# Patient Record
Sex: Female | Born: 1985 | Race: Black or African American | Hispanic: No | Marital: Married | Smoking: Never smoker
Health system: Southern US, Community
[De-identification: ages and names within clinical notes are randomized; demographics above are authoritative.]

## PROBLEM LIST (undated history)

## (undated) DIAGNOSIS — E669 Obesity, unspecified: Secondary | ICD-10-CM

## (undated) DIAGNOSIS — M549 Dorsalgia, unspecified: Secondary | ICD-10-CM

## (undated) DIAGNOSIS — E039 Hypothyroidism, unspecified: Secondary | ICD-10-CM

## (undated) DIAGNOSIS — R600 Localized edema: Secondary | ICD-10-CM

## (undated) DIAGNOSIS — D649 Anemia, unspecified: Secondary | ICD-10-CM

## (undated) DIAGNOSIS — I10 Essential (primary) hypertension: Secondary | ICD-10-CM

## (undated) DIAGNOSIS — M2550 Pain in unspecified joint: Secondary | ICD-10-CM

## (undated) DIAGNOSIS — R002 Palpitations: Secondary | ICD-10-CM

## (undated) DIAGNOSIS — G4730 Sleep apnea, unspecified: Secondary | ICD-10-CM

## (undated) DIAGNOSIS — F419 Anxiety disorder, unspecified: Secondary | ICD-10-CM

## (undated) DIAGNOSIS — R0602 Shortness of breath: Secondary | ICD-10-CM

## (undated) DIAGNOSIS — R7303 Prediabetes: Secondary | ICD-10-CM

## (undated) DIAGNOSIS — E559 Vitamin D deficiency, unspecified: Secondary | ICD-10-CM

## (undated) DIAGNOSIS — IMO0002 Reserved for concepts with insufficient information to code with codable children: Secondary | ICD-10-CM

## (undated) HISTORY — DX: Anxiety disorder, unspecified: F41.9

## (undated) HISTORY — DX: Palpitations: R00.2

## (undated) HISTORY — DX: Obesity, unspecified: E66.9

## (undated) HISTORY — PX: PILONIDAL CYST EXCISION: SHX744

## (undated) HISTORY — DX: Prediabetes: R73.03

## (undated) HISTORY — DX: Vitamin D deficiency, unspecified: E55.9

## (undated) HISTORY — DX: Hypothyroidism, unspecified: E03.9

## (undated) HISTORY — DX: Shortness of breath: R06.02

## (undated) HISTORY — DX: Localized edema: R60.0

## (undated) HISTORY — DX: Dorsalgia, unspecified: M54.9

## (undated) HISTORY — DX: Anemia, unspecified: D64.9

## (undated) HISTORY — DX: Sleep apnea, unspecified: G47.30

## (undated) HISTORY — DX: Essential (primary) hypertension: I10

## (undated) HISTORY — PX: BREAST CYST EXCISION: SHX579

## (undated) HISTORY — DX: Pain in unspecified joint: M25.50

---

## 2005-04-11 DIAGNOSIS — IMO0002 Reserved for concepts with insufficient information to code with codable children: Secondary | ICD-10-CM

## 2005-04-11 DIAGNOSIS — R87619 Unspecified abnormal cytological findings in specimens from cervix uteri: Secondary | ICD-10-CM

## 2005-04-11 HISTORY — DX: Reserved for concepts with insufficient information to code with codable children: IMO0002

## 2005-04-11 HISTORY — DX: Unspecified abnormal cytological findings in specimens from cervix uteri: R87.619

## 2006-10-20 ENCOUNTER — Ambulatory Visit (HOSPITAL_COMMUNITY): Admission: RE | Admit: 2006-10-20 | Discharge: 2006-10-20 | Payer: Self-pay | Admitting: Obstetrics and Gynecology

## 2008-10-19 ENCOUNTER — Emergency Department (HOSPITAL_COMMUNITY): Admission: EM | Admit: 2008-10-19 | Discharge: 2008-10-19 | Payer: Self-pay | Admitting: Emergency Medicine

## 2009-02-09 HISTORY — PX: ANKLE SURGERY: SHX546

## 2009-08-14 ENCOUNTER — Emergency Department: Payer: Self-pay | Admitting: Emergency Medicine

## 2010-01-18 ENCOUNTER — Ambulatory Visit: Payer: Self-pay | Admitting: Internal Medicine

## 2010-02-02 ENCOUNTER — Ambulatory Visit: Payer: Self-pay | Admitting: Emergency Medicine

## 2010-02-05 ENCOUNTER — Ambulatory Visit: Payer: Self-pay | Admitting: Emergency Medicine

## 2010-02-10 LAB — PATHOLOGY REPORT

## 2010-04-11 NOTE — L&D Delivery Note (Signed)
Patient was C/C/0 and pushed for 8 minutes with epidural.   NSVD  female infant, Apgars 9/9, weight 8#9.   The patient had one laceration second degree laceration repaired with 2-0 vicryl. Fundus was firm. EBL was expected. Placenta was delivered intact. Vagina was clear.  Baby was vigorous to bedside.  Misty Mathis

## 2010-06-08 LAB — RUBELLA ANTIBODY, IGM: Rubella: IMMUNE

## 2010-06-08 LAB — ABO/RH: RH Type: POSITIVE

## 2010-06-08 LAB — HEPATITIS B SURFACE ANTIGEN: Hepatitis B Surface Ag: NEGATIVE

## 2010-07-14 ENCOUNTER — Other Ambulatory Visit (HOSPITAL_COMMUNITY): Payer: Self-pay | Admitting: Obstetrics & Gynecology

## 2010-07-14 DIAGNOSIS — Z3689 Encounter for other specified antenatal screening: Secondary | ICD-10-CM

## 2010-07-18 LAB — WET PREP, GENITAL
Clue Cells Wet Prep HPF POC: NONE SEEN
Trich, Wet Prep: NONE SEEN
Yeast Wet Prep HPF POC: NONE SEEN

## 2010-07-18 LAB — POCT I-STAT, CHEM 8
Calcium, Ion: 1.16 mmol/L (ref 1.12–1.32)
Chloride: 103 mEq/L (ref 96–112)
Glucose, Bld: 89 mg/dL (ref 70–99)
HCT: 35 % — ABNORMAL LOW (ref 36.0–46.0)

## 2010-07-18 LAB — URINALYSIS, ROUTINE W REFLEX MICROSCOPIC
Bilirubin Urine: NEGATIVE
Glucose, UA: NEGATIVE mg/dL
Ketones, ur: NEGATIVE mg/dL
Nitrite: NEGATIVE
pH: 6.5 (ref 5.0–8.0)

## 2010-07-18 LAB — URINE MICROSCOPIC-ADD ON

## 2010-08-12 ENCOUNTER — Ambulatory Visit (HOSPITAL_COMMUNITY)
Admission: RE | Admit: 2010-08-12 | Discharge: 2010-08-12 | Disposition: A | Discharge status: Home / Self Care | Payer: 59 | Source: Ambulatory Visit | Attending: Obstetrics & Gynecology | Admitting: Obstetrics & Gynecology

## 2010-08-12 ENCOUNTER — Ambulatory Visit (HOSPITAL_COMMUNITY): Payer: 59

## 2010-08-12 DIAGNOSIS — Z3689 Encounter for other specified antenatal screening: Secondary | ICD-10-CM

## 2010-08-12 DIAGNOSIS — Z363 Encounter for antenatal screening for malformations: Secondary | ICD-10-CM | POA: Insufficient documentation

## 2010-08-12 DIAGNOSIS — Z1389 Encounter for screening for other disorder: Secondary | ICD-10-CM | POA: Insufficient documentation

## 2010-08-12 DIAGNOSIS — O358XX0 Maternal care for other (suspected) fetal abnormality and damage, not applicable or unspecified: Secondary | ICD-10-CM | POA: Insufficient documentation

## 2010-08-13 ENCOUNTER — Ambulatory Visit (HOSPITAL_COMMUNITY): Payer: 59

## 2010-10-25 LAB — RPR: RPR: NONREACTIVE

## 2010-12-14 LAB — STREP B DNA PROBE: GBS: NEGATIVE

## 2011-01-05 ENCOUNTER — Inpatient Hospital Stay (HOSPITAL_COMMUNITY): Payer: Medicaid Other

## 2011-01-05 ENCOUNTER — Inpatient Hospital Stay (HOSPITAL_COMMUNITY)
Admission: AD | Admit: 2011-01-05 | Discharge: 2011-01-06 | Disposition: A | Discharge status: Home / Self Care | Payer: Medicaid Other | Source: Ambulatory Visit | Attending: Obstetrics and Gynecology | Admitting: Obstetrics and Gynecology

## 2011-01-05 ENCOUNTER — Encounter (HOSPITAL_COMMUNITY): Payer: Self-pay | Admitting: *Deleted

## 2011-01-05 DIAGNOSIS — O368190 Decreased fetal movements, unspecified trimester, not applicable or unspecified: Secondary | ICD-10-CM | POA: Insufficient documentation

## 2011-01-05 NOTE — Progress Notes (Signed)
AT 8 PM - CALLED AND TALKED TO DR Dareen Piano- HE TOLD HER TO DRINK A COKE- IF  NOT INCREASE MOVEMENT- THEN TO COME TO HOSPITAL.

## 2011-01-05 NOTE — Progress Notes (Signed)
PT SAYS SHE WENT TO DR ON Tuesday- BABY MOVING   SLIGHT  LESS THAN NL.   SAYS SHE FEELS HIM MOVE- JUST NOT AS MUCH AS NL - LAST TIME - WAS IN LOBBY. 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

## 2011-01-05 NOTE — Progress Notes (Signed)
Pt reports decrease in fetal movement today. Denies LOF or bleeding

## 2011-01-06 NOTE — ED Notes (Signed)
Pt is a 24y/o black female, G4P0 at 39 weeks who presented to ER c/o a decrease in FM.  She has FM but states that it is less than normal.  Pt had a reactive NST, but with decreased BTB variability. A BPP was 8/8.  She also had an accel with scalp stim.  Pt was discharged to home and told to continue [facility] B[facility].

## 2011-01-13 ENCOUNTER — Encounter (HOSPITAL_COMMUNITY): Payer: Self-pay | Admitting: Anesthesiology

## 2011-01-13 ENCOUNTER — Inpatient Hospital Stay (HOSPITAL_COMMUNITY): Payer: Medicaid Other | Admitting: Anesthesiology

## 2011-01-13 ENCOUNTER — Encounter (HOSPITAL_COMMUNITY): Payer: Self-pay | Admitting: *Deleted

## 2011-01-13 ENCOUNTER — Inpatient Hospital Stay (HOSPITAL_COMMUNITY)
Admission: AD | Admit: 2011-01-13 | Discharge: 2011-01-15 | DRG: 767 | Disposition: A | Discharge status: Home / Self Care | Payer: Medicaid Other | Source: Ambulatory Visit | Attending: Obstetrics and Gynecology | Admitting: Obstetrics and Gynecology

## 2011-01-13 DIAGNOSIS — A6000 Herpesviral infection of urogenital system, unspecified: Secondary | ICD-10-CM | POA: Diagnosis present

## 2011-01-13 DIAGNOSIS — Z302 Encounter for sterilization: Secondary | ICD-10-CM

## 2011-01-13 DIAGNOSIS — B009 Herpesviral infection, unspecified: Secondary | ICD-10-CM | POA: Insufficient documentation

## 2011-01-13 DIAGNOSIS — O98519 Other viral diseases complicating pregnancy, unspecified trimester: Secondary | ICD-10-CM | POA: Diagnosis present

## 2011-01-13 DIAGNOSIS — Z331 Pregnant state, incidental: Secondary | ICD-10-CM

## 2011-01-13 DIAGNOSIS — D573 Sickle-cell trait: Secondary | ICD-10-CM | POA: Diagnosis present

## 2011-01-13 DIAGNOSIS — O9902 Anemia complicating childbirth: Secondary | ICD-10-CM | POA: Diagnosis present

## 2011-01-13 HISTORY — DX: Reserved for concepts with insufficient information to code with codable children: IMO0002

## 2011-01-13 LAB — CBC
MCH: 30.2 pg (ref 26.0–34.0)
MCHC: 33.2 g/dL (ref 30.0–36.0)
MCV: 91 fL (ref 78.0–100.0)
Platelets: 257 10*3/uL (ref 150–400)

## 2011-01-13 MED ORDER — LACTATED RINGERS IV SOLN
INTRAVENOUS | Status: DC
  Administered 2011-01-13: 500 mL via INTRAVENOUS
  Administered 2011-01-14: 01:00:00 via INTRAVENOUS

## 2011-01-13 MED ORDER — ONDANSETRON HCL 4 MG/2ML IJ SOLN: 4.0000 mg | Freq: Four times a day (QID) | INTRAMUSCULAR | Status: DC | PRN

## 2011-01-13 MED ORDER — LACTATED RINGERS IV SOLN: 500.0000 mL | INTRAVENOUS | Status: DC | PRN

## 2011-01-13 MED ORDER — ACETAMINOPHEN 325 MG PO TABS: 650.0000 mg | ORAL_TABLET | ORAL | Status: DC | PRN

## 2011-01-13 MED ORDER — PHENYLEPHRINE 40 MCG/ML (10ML) SYRINGE FOR IV PUSH (FOR BLOOD PRESSURE SUPPORT)
80.0000 ug | PREFILLED_SYRINGE | INTRAVENOUS | Status: DC | PRN
  Filled 2011-01-13: qty 5

## 2011-01-13 MED ORDER — CITRIC ACID-SODIUM CITRATE 334-500 MG/5ML PO SOLN: 30.0000 mL | ORAL | Status: DC | PRN

## 2011-01-13 MED ORDER — FLEET ENEMA 7-19 GM/118ML RE ENEM: 1.0000 | ENEMA | RECTAL | Status: DC | PRN

## 2011-01-13 MED ORDER — OXYTOCIN BOLUS FROM INFUSION
500.0000 mL | Freq: Once | INTRAVENOUS | Status: DC
  Filled 2011-01-13: qty 500

## 2011-01-13 MED ORDER — IBUPROFEN 600 MG PO TABS: 600.0000 mg | ORAL_TABLET | Freq: Four times a day (QID) | ORAL | Status: DC | PRN

## 2011-01-13 MED ORDER — OXYCODONE-ACETAMINOPHEN 5-325 MG PO TABS: 2.0000 | ORAL_TABLET | ORAL | Status: DC | PRN

## 2011-01-13 MED ORDER — DIPHENHYDRAMINE HCL 50 MG/ML IJ SOLN: 12.5000 mg | INTRAMUSCULAR | Status: DC | PRN

## 2011-01-13 MED ORDER — EPHEDRINE 5 MG/ML INJ
10.0000 mg | INTRAVENOUS | Status: DC | PRN
  Filled 2011-01-13 (×2): qty 4

## 2011-01-13 MED ORDER — FENTANYL 2.5 MCG/ML BUPIVACAINE 1/10 % EPIDURAL INFUSION (WH - ANES)
14.0000 mL/h | INTRAMUSCULAR | Status: DC
  Administered 2011-01-13: 14 mL/h via EPIDURAL
  Filled 2011-01-13: qty 60

## 2011-01-13 MED ORDER — LIDOCAINE HCL (PF) 1 % IJ SOLN
30.0000 mL | INTRAMUSCULAR | Status: DC | PRN
  Administered 2011-01-14: 30 mL via SUBCUTANEOUS
  Filled 2011-01-13: qty 30

## 2011-01-13 MED ORDER — EPHEDRINE 5 MG/ML INJ
10.0000 mg | INTRAVENOUS | Status: DC | PRN
  Filled 2011-01-13: qty 4

## 2011-01-13 MED ORDER — OXYTOCIN 20 UNITS IN LACTATED RINGERS INFUSION - SIMPLE
125.0000 mL/h | Freq: Once | INTRAVENOUS | Status: AC
  Administered 2011-01-14: 999 mL/h via INTRAVENOUS
  Filled 2011-01-13: qty 1000

## 2011-01-13 MED ORDER — LIDOCAINE HCL 1.5 % IJ SOLN
INTRAMUSCULAR | Status: DC | PRN
  Administered 2011-01-13: 2 mL via INTRADERMAL
  Administered 2011-01-13 (×2): 5 mL via INTRADERMAL

## 2011-01-13 MED ORDER — PHENYLEPHRINE 40 MCG/ML (10ML) SYRINGE FOR IV PUSH (FOR BLOOD PRESSURE SUPPORT)
80.0000 ug | PREFILLED_SYRINGE | INTRAVENOUS | Status: DC | PRN
  Filled 2011-01-13 (×2): qty 5

## 2011-01-13 MED ORDER — LACTATED RINGERS IV SOLN: 500.0000 mL | Freq: Once | INTRAVENOUS | Status: DC

## 2011-01-13 NOTE — Anesthesia Procedure Notes (Signed)
Epidural Patient location during procedure: OB Start time: 01/13/2011 11:48 PM Reason for block: procedure for pain  Staffing Performed by: anesthesiologist   Preanesthetic Checklist Completed: patient identified, site marked, surgical consent, pre-op evaluation, timeout performed, IV checked, risks and benefits discussed and monitors and equipment checked  Epidural Patient position: sitting Prep: site prepped and draped and DuraPrep Patient monitoring: continuous pulse ox and blood pressure Approach: midline Injection technique: LOR air  Needle:  Needle type: Tuohy  Needle gauge: 17 G Needle length: 9 cm Needle insertion depth: 6 cm Catheter type: closed end flexible Catheter size: 19 Gauge Catheter at skin depth: 11 cm Test dose: negative  Assessment Events: blood not aspirated, injection not painful, no injection resistance, negative IV test and no paresthesia

## 2011-01-13 NOTE — Progress Notes (Signed)
States Dr Aldona Bar stripped her membrames on Mon-has been contracting since then-has gotten harder today-was 4-5 at 10am in the office

## 2011-01-13 NOTE — Progress Notes (Signed)
SAYS HAD STRIPPED MEMBRANES ON Monday- VE- 2-3 CM .  BUT TODAY- 4-5 CM.     HURT BAD.

## 2011-01-13 NOTE — Anesthesia Preprocedure Evaluation (Signed)
Anesthesia Evaluation  Name, MR# and DOB Patient awake  General Assessment Comment  Reviewed: Allergy & Precautions, H&P , NPO status , Patient's Chart, lab work & pertinent test results, reviewed documented beta blocker date and time   History of Anesthesia Complications Negative for: history of anesthetic complications  Airway Mallampati: II TM Distance: >3 FB Neck ROM: full    Dental  (+) Teeth Intact   Pulmonary  clear to auscultation        Cardiovascular regular Normal    Neuro/Psych Negative Neurological ROS  Negative Psych ROS   GI/Hepatic negative GI ROS Neg liver ROS    Endo/Other  Morbid obesity  Renal/GU negative Renal ROS     Musculoskeletal   Abdominal   Peds  Hematology negative hematology ROS (+)   Anesthesia Other Findings   Reproductive/Obstetrics (+) Pregnancy                           Anesthesia Physical Anesthesia Plan  ASA: II  Anesthesia Plan: Epidural   Post-op Pain Management:    Induction:   Airway Management Planned:   Additional Equipment:   Intra-op Plan:   Post-operative Plan:   Informed Consent: I have reviewed the patients History and Physical, chart, labs and discussed the procedure including the risks, benefits and alternatives for the proposed anesthesia with the patient or authorized representative who has indicated his/her understanding and acceptance.     Plan Discussed with:   Anesthesia Plan Comments:         Anesthesia Quick Evaluation  

## 2011-01-13 NOTE — Progress Notes (Signed)
HX- HSV- DENIES OUTBREAK- TAKING VALTREX.  DENIES MRSA.

## 2011-01-14 ENCOUNTER — Encounter (HOSPITAL_COMMUNITY): Payer: Self-pay | Admitting: Anesthesiology

## 2011-01-14 ENCOUNTER — Inpatient Hospital Stay (HOSPITAL_COMMUNITY): Payer: Medicaid Other | Admitting: Anesthesiology

## 2011-01-14 ENCOUNTER — Other Ambulatory Visit: Payer: Self-pay | Admitting: Obstetrics & Gynecology

## 2011-01-14 ENCOUNTER — Encounter (HOSPITAL_COMMUNITY)
Admission: AD | Disposition: A | Discharge status: Home / Self Care | Payer: Self-pay | Source: Ambulatory Visit | Attending: Obstetrics and Gynecology

## 2011-01-14 ENCOUNTER — Encounter (HOSPITAL_COMMUNITY): Payer: Self-pay | Admitting: *Deleted

## 2011-01-14 HISTORY — PX: TUBAL LIGATION: SHX77

## 2011-01-14 LAB — RPR: RPR Ser Ql: NONREACTIVE

## 2011-01-14 SURGERY — LIGATION, FALLOPIAN TUBE, POSTPARTUM
Anesthesia: Choice | Site: Abdomen | Laterality: Bilateral | Wound class: Clean

## 2011-01-14 MED ORDER — LACTATED RINGERS IV SOLN
INTRAVENOUS | Status: DC
  Administered 2011-01-14 (×2): via INTRAVENOUS

## 2011-01-14 MED ORDER — LANOLIN HYDROUS EX OINT: TOPICAL_OINTMENT | CUTANEOUS | Status: DC | PRN

## 2011-01-14 MED ORDER — ZOLPIDEM TARTRATE 5 MG PO TABS: 5.0000 mg | ORAL_TABLET | Freq: Every evening | ORAL | Status: DC | PRN

## 2011-01-14 MED ORDER — ONDANSETRON HCL 4 MG PO TABS: 4.0000 mg | ORAL_TABLET | ORAL | Status: DC | PRN

## 2011-01-14 MED ORDER — MIDAZOLAM HCL 2 MG/2ML IJ SOLN
INTRAMUSCULAR | Status: AC
  Filled 2011-01-14: qty 2

## 2011-01-14 MED ORDER — KETOROLAC TROMETHAMINE 30 MG/ML IJ SOLN: 15.0000 mg | Freq: Once | INTRAMUSCULAR | Status: DC | PRN

## 2011-01-14 MED ORDER — DIBUCAINE 1 % RE OINT
1.0000 "application " | TOPICAL_OINTMENT | RECTAL | Status: DC | PRN
  Filled 2011-01-14: qty 28

## 2011-01-14 MED ORDER — ACETAMINOPHEN 325 MG PO TABS: 325.0000 mg | ORAL_TABLET | ORAL | Status: DC | PRN

## 2011-01-14 MED ORDER — KETOROLAC TROMETHAMINE 30 MG/ML IJ SOLN
INTRAMUSCULAR | Status: AC
  Filled 2011-01-14: qty 1

## 2011-01-14 MED ORDER — FENTANYL CITRATE 0.05 MG/ML IJ SOLN
INTRAMUSCULAR | Status: AC
  Filled 2011-01-14: qty 2

## 2011-01-14 MED ORDER — ONDANSETRON HCL 4 MG/2ML IJ SOLN
INTRAMUSCULAR | Status: DC | PRN
  Administered 2011-01-14: 4 mg via INTRAVENOUS

## 2011-01-14 MED ORDER — PRENATAL PLUS 27-1 MG PO TABS
1.0000 | ORAL_TABLET | Freq: Every day | ORAL | Status: DC
  Administered 2011-01-15: 1 via ORAL
  Filled 2011-01-14 (×2): qty 1

## 2011-01-14 MED ORDER — ONDANSETRON HCL 4 MG/2ML IJ SOLN
INTRAMUSCULAR | Status: AC
  Filled 2011-01-14: qty 2

## 2011-01-14 MED ORDER — WITCH HAZEL-GLYCERIN EX PADS: 1.0000 "application " | MEDICATED_PAD | CUTANEOUS | Status: DC | PRN

## 2011-01-14 MED ORDER — SODIUM CHLORIDE 0.9 % IV SOLN: INTRAVENOUS | Status: DC

## 2011-01-14 MED ORDER — TERBUTALINE SULFATE 1 MG/ML IJ SOLN: 0.2500 mg | Freq: Once | INTRAMUSCULAR | Status: DC | PRN

## 2011-01-14 MED ORDER — FENTANYL CITRATE 0.05 MG/ML IJ SOLN: 25.0000 ug | INTRAMUSCULAR | Status: DC | PRN

## 2011-01-14 MED ORDER — OXYCODONE-ACETAMINOPHEN 5-325 MG PO TABS
1.0000 | ORAL_TABLET | ORAL | Status: DC | PRN
  Administered 2011-01-14: 2 via ORAL
  Filled 2011-01-14: qty 2

## 2011-01-14 MED ORDER — PROMETHAZINE HCL 25 MG/ML IJ SOLN: 6.2500 mg | INTRAMUSCULAR | Status: DC | PRN

## 2011-01-14 MED ORDER — KETOROLAC TROMETHAMINE 30 MG/ML IJ SOLN
INTRAMUSCULAR | Status: DC | PRN
  Administered 2011-01-14: 30 mg via INTRAVENOUS

## 2011-01-14 MED ORDER — ONDANSETRON HCL 4 MG/2ML IJ SOLN: 4.0000 mg | INTRAMUSCULAR | Status: DC | PRN

## 2011-01-14 MED ORDER — SENNOSIDES-DOCUSATE SODIUM 8.6-50 MG PO TABS
2.0000 | ORAL_TABLET | Freq: Every day | ORAL | Status: DC
  Administered 2011-01-14: 2 via ORAL

## 2011-01-14 MED ORDER — ACETAMINOPHEN 325 MG PO TABS
650.0000 mg | ORAL_TABLET | Freq: Four times a day (QID) | ORAL | Status: DC | PRN
  Administered 2011-01-15: 650 mg via ORAL
  Filled 2011-01-14 (×2): qty 2

## 2011-01-14 MED ORDER — BENZOCAINE-MENTHOL 20-0.5 % EX AERO
INHALATION_SPRAY | CUTANEOUS | Status: AC
  Filled 2011-01-14: qty 56

## 2011-01-14 MED ORDER — SODIUM BICARBONATE 8.4 % IV SOLN
INTRAVENOUS | Status: AC
  Filled 2011-01-14: qty 50

## 2011-01-14 MED ORDER — PRENATAL PLUS 27-1 MG PO TABS
1.0000 | ORAL_TABLET | Freq: Every day | ORAL | Status: DC
Start: 2011-01-14 — End: 2011-01-14

## 2011-01-14 MED ORDER — BENZOCAINE-MENTHOL 20-0.5 % EX AERO
1.0000 "application " | INHALATION_SPRAY | CUTANEOUS | Status: DC | PRN
  Filled 2011-01-14: qty 56

## 2011-01-14 MED ORDER — TETANUS-DIPHTH-ACELL PERTUSSIS 5-2.5-18.5 LF-MCG/0.5 IM SUSP
0.5000 mL | Freq: Once | INTRAMUSCULAR | Status: AC
  Administered 2011-01-15: 0.5 mL via INTRAMUSCULAR
  Filled 2011-01-14: qty 0.5

## 2011-01-14 MED ORDER — SODIUM BICARBONATE 8.4 % IV SOLN
INTRAVENOUS | Status: DC | PRN
  Administered 2011-01-14: 3 mL via EPIDURAL

## 2011-01-14 MED ORDER — MIDAZOLAM HCL 5 MG/5ML IJ SOLN
INTRAMUSCULAR | Status: DC | PRN
  Administered 2011-01-14 (×2): 0.5 mg via INTRAVENOUS
  Administered 2011-01-14: 1 mg via INTRAVENOUS

## 2011-01-14 MED ORDER — SIMETHICONE 80 MG PO CHEW: 80.0000 mg | CHEWABLE_TABLET | ORAL | Status: DC | PRN

## 2011-01-14 MED ORDER — OXYTOCIN 20 UNITS IN LACTATED RINGERS INFUSION - SIMPLE
1.0000 m[IU]/min | INTRAVENOUS | Status: DC
  Administered 2011-01-14: 2 m[IU]/min via INTRAVENOUS
  Filled 2011-01-14: qty 1000

## 2011-01-14 MED ORDER — FENTANYL CITRATE 0.05 MG/ML IJ SOLN
INTRAMUSCULAR | Status: DC | PRN
  Administered 2011-01-14 (×2): 50 ug via INTRAVENOUS

## 2011-01-14 MED ORDER — LIDOCAINE-EPINEPHRINE (PF) 2 %-1:200000 IJ SOLN
INTRAMUSCULAR | Status: AC
  Filled 2011-01-14: qty 20

## 2011-01-14 SURGICAL SUPPLY — 16 items
CLOTH BEACON ORANGE TIMEOUT ST (SAFETY) ×2 IMPLANT
CONTAINER PREFILL 10% NBF 15ML (MISCELLANEOUS) ×4 IMPLANT
DRSG COVADERM PLUS 2X2 (GAUZE/BANDAGES/DRESSINGS) ×2 IMPLANT
GLOVE BIO SURGEON STRL SZ7.5 (GLOVE) ×4 IMPLANT
GOWN PREVENTION PLUS LG XLONG (DISPOSABLE) ×2 IMPLANT
GOWN PREVENTION PLUS XLARGE (GOWN DISPOSABLE) ×2 IMPLANT
NS IRRIG 1000ML POUR BTL (IV SOLUTION) ×2 IMPLANT
PACK ABDOMINAL MINOR (CUSTOM PROCEDURE TRAY) ×2 IMPLANT
SPONGE LAP 4X18 X RAY DECT (DISPOSABLE) IMPLANT
SUT PLAIN 1 NONE 54 (SUTURE) ×2 IMPLANT
SUT VIC AB 0 CT1 27 (SUTURE) ×1
SUT VIC AB 0 CT1 27XBRD ANBCTR (SUTURE) ×1 IMPLANT
SUT VIC AB 4-0 PS2 27 (SUTURE) ×2 IMPLANT
TOWEL OR 17X24 6PK STRL BLUE (TOWEL DISPOSABLE) ×4 IMPLANT
TRAY FOLEY CATH 14FR (SET/KITS/TRAYS/PACK) ×2 IMPLANT
WATER STERILE IRR 1000ML POUR (IV SOLUTION) ×2 IMPLANT

## 2011-01-14 NOTE — Anesthesia Postprocedure Evaluation (Signed)
  Anesthesia Post-op Note  Patient: Misty Mathis  Procedure(s) Performed:  POST PARTUM TUBAL LIGATION  Patient Location: PACU and Women's Unit  Anesthesia Type: Epidural  Level of Consciousness: awake, alert  and oriented  Airway and Oxygen Therapy: Patient Spontanous Breathing  Post-op Pain: none  Post-op Assessment: Post-op Vital signs reviewed and Patient's Cardiovascular Status Stable  Post-op Vital Signs: Reviewed and stable  Complications: No apparent anesthesia complications

## 2011-01-14 NOTE — Transfer of Care (Signed)
Immediate Anesthesia Transfer of Care Note  Patient: Misty Mathis  Procedure(s) Performed:  POST PARTUM TUBAL LIGATION  Patient Location: PACU  Anesthesia Type: Epidural  Level of Consciousness: awake, alert  and oriented  Airway & Oxygen Therapy: Patient Spontanous Breathing  Post-op Assessment: Report given to PACU RN and Post -op Vital signs reviewed and stable  Post vital signs: Reviewed and stable  Complications: No apparent anesthesia complications

## 2011-01-14 NOTE — Anesthesia Preprocedure Evaluation (Signed)
Anesthesia Evaluation  Name, MR# and DOB Patient awake  General Assessment Comment  Reviewed: Allergy & Precautions, H&P , NPO status , Patient's Chart, lab work & pertinent test results, reviewed documented beta blocker date and time   History of Anesthesia Complications Negative for: history of anesthetic complications  Airway Mallampati: IV TM Distance: >3 FB Neck ROM: full    Dental  (+) Teeth Intact   Pulmonary  clear to auscultation        Cardiovascular Exercise Tolerance: Good regular Normal    Neuro/Psych Negative Neurological ROS  Negative Psych ROS   GI/Hepatic negative GI ROS Neg liver ROS    Endo/Other  Negative Endocrine ROSMorbid obesity  Renal/GU negative Renal ROS     Musculoskeletal negative musculoskeletal ROS (+)   Abdominal   Peds negative pediatric ROS (+)  Hematology negative hematology ROS (+)   Anesthesia Other Findings   Reproductive/Obstetrics negative OB ROS (+) Pregnancy                           Anesthesia Physical Anesthesia Plan  ASA: III  Anesthesia Plan: Epidural   Post-op Pain Management:    Induction:   Airway Management Planned:   Additional Equipment:   Intra-op Plan:   Post-operative Plan:   Informed Consent:   Plan Discussed with:   Anesthesia Plan Comments:         Anesthesia Quick Evaluation

## 2011-01-14 NOTE — H&P (Addendum)
25 y.o. @40 .3  G3P3003 comes in c/o labor.  Otherwise has good fetal movement and no bleeding. No other complaints, denies any herpetic lesions or prodromal symptoms.  Past Medical History  Diagnosis Date  . Abnormal Pap smear   . Herpes simplex     Past Surgical History  Procedure Date  . Ankle surgery 02/2009  . Breast cyst excision <35yr    OB History    Grav Para Term Preterm Abortions TAB SAB Ect Mult Living   3 3 3       3      # Outc Date GA Lbr Len/2nd Wgt Sex Del Anes PTL Lv   1 TRM 10/12 [redacted]w[redacted]d 08:38 / 00:08 8lb9.2oz(3.89kg) M SVD EPI  Yes   2 TRM            3 TRM               History   Social History  . Marital Status: Married    Spouse Name: N/A    Number of Children: N/A  . Years of Education: N/A   Occupational History  . Not on file.   Social History Main Topics  . Smoking status: Not on file  . Smokeless tobacco: Not on file  . Alcohol Use:   . Drug Use:   . Sexually Active:    Other Topics Concern  . Not on file   Social History Narrative  . No narrative on file   Review of patient's allergies indicates no known allergies.   Prenatal Course:  +HSV, MOS, sickle cell trait (FOB neg)  Filed Vitals:   01/14/11 0332  BP: 122/66  Pulse: 94  Temp:   Resp:      Lungs/Cor:  NAD Abdomen:  soft, gravid Ex:  no cords, erythema SVE:  6 at admission FHTs:  135 mod var, good STV, NST R Toco:  q2-5    A/P   Admit to L&D Epidural if desired  GBS Neg Routine care. CIRC in peds office Pt on schedule for PPTL  [place], Luther Parody

## 2011-01-14 NOTE — Progress Notes (Signed)
Mom and baby stable.  Patient posted for a Tubal Sterilization this AM (30 day Medicaid papers signed but remain in the office -- office called and papers to be faxed to OR)>  CBC today 11.4/9.8 with 257K platelets.  Breast feeding.  Understands sterilization procedure is meant to be 100% permanent but is not necessarily 100% perfect-subsequent pregnancy can occur.  Also aware that because of her obesity, incision may need to be longer than usual.  Denies any factors that would potentially complicate carrying out PP Tubal.  Circ to be done elsewhere.

## 2011-01-14 NOTE — Anesthesia Postprocedure Evaluation (Signed)
Anesthesia Post Note  Patient: Misty Mathis  Procedure(s) Performed:  POST PARTUM TUBAL LIGATION  Anesthesia type: Epidural  Patient location:pacu  Post pain: Pain level controlled  Post assessment: Post-op Vital signs reviewed  Last Vitals:  Filed Vitals:   01/14/11 0943  BP: 128/83  Pulse: 99  Temp: 99.9 F (37.7 C)  Resp: 18    Post vital signs: Reviewed  Level of consciousness: awake  Complications: No apparent anesthesia complications

## 2011-01-14 NOTE — Op Note (Signed)
Preoperative diagnoses-  #1 postpartum-desire for permanent elective sterilization  Postop diagnoses-  Same plus segment of each fallopian tube-pathology pending  Procedure-  Postpartum tubal sterilization procedure  Surgeon-  Dr. Aldona Bar  Anesthesia-  Augmented epidural  History:   this patient, a 25 year old gravida 3 now para 3 delivered on the morning of 10/5 and during her antenatal course had requested a permanent elective sterilization procedure understanding that such procedure is meant to be 100% permanent unfortunately is not 100% perfect-subsequent pregnancy can result. This was all discussed with her again on the morning of 10/5. According to her wishes she is now being taken to the operating room for a permanent elective sterilization procedure postpartum.  Procedure:  After the adequate augmentation of her epidural the patient was prepped and draped in the usual fashion with a Foley catheter placed as part of the prep. Good anesthetic levels were documented and procedure was begun.  A 4 cm subumbilical midline transverse skin incision was made and with minimal difficulty the fascia and peritoneum identified and entered peripherally. At this time a pack was placed and with positioning and retracting it was possible to identify the right fallopian tube.  The fimbriated end of the right fallopian tube was identified and then in the midportion of the right fallopian tube a knuckle of tube was created and a free tie of #1 plain catgut suture tied down about the knuckle and knuckle was excised and sent to pathology appropriately labeled and hemostasis was adequate. A similar procedure was carried out on the left fallopian tube. At this time the procedure was felt to be complete. No intra-abdominal pathology was appreciated although evaluation was limited. The pack was removed, all counts noted to be correct, and then closure of the abdomen was carried out in layers.  The abdominal  peritoneum and fascia was closed together using interrupted 0 Vicryl. Thereafter the skin was closed using a subcuticular closure of 3-0 Vicryl in a running fashion and a sterile pressure dressing was applied. At this time the procedure was felt to be complete and the patient was transferred to the recovery room in satisfactory condition. All counts correct x2. Pathologic specimen consisted of the segment of each fallopian tube.  Condition on arrival in recovery in satisfactory.

## 2011-01-14 NOTE — Progress Notes (Signed)
UR Chart review completed.  

## 2011-01-14 NOTE — Anesthesia Postprocedure Evaluation (Signed)
  Anesthesia Post-op Note  Patient: Misty Mathis  Procedure(s) Performed: * Lumbar Epidural for L&D *  Patient Location: Women's Unit  Anesthesia Type: Epidural  Level of Consciousness: awake, alert  and oriented  Airway and Oxygen Therapy: Patient Spontanous Breathing  Post-op Pain: none  Post-op Assessment: Post-op Vital signs reviewed, Patient's Cardiovascular Status Stable, Respiratory Function Stable, Patent Airway, No signs of Nausea or vomiting, Adequate PO intake, Pain level controlled, No headache, No backache, No residual numbness and No residual motor weakness  Post-op Vital Signs: Reviewed and stable  Complications: No apparent anesthesia complications

## 2011-01-15 LAB — CBC
Hemoglobin: 10 g/dL — ABNORMAL LOW (ref 12.0–15.0)
MCH: 30.4 pg (ref 26.0–34.0)
MCV: 91.5 fL (ref 78.0–100.0)
Platelets: 255 10*3/uL (ref 150–400)
RBC: 3.29 MIL/uL — ABNORMAL LOW (ref 3.87–5.11)

## 2011-01-15 MED ORDER — DIPHENHYDRAMINE HCL 25 MG PO CAPS: 25.0000 mg | ORAL_CAPSULE | Freq: Four times a day (QID) | ORAL | Status: DC | PRN

## 2011-01-15 MED ORDER — LACTATED RINGERS IV SOLN: INTRAVENOUS | Status: DC

## 2011-01-15 NOTE — Progress Notes (Signed)
D/c instructions for mom and baby reviewed with pt. and s.o.  No home equipment needed, ambulated to car with staff without incident, hugs tag removed from infant, d/c'd home with husband.

## 2011-01-15 NOTE — Discharge Summary (Signed)
Discharge diagnoses-  #1-term pregnancy delivered 8 lbs. 9 oz. Female infant Apgars 9 and 9-  #2-blood type O-positive  #3-desire for permanent elective sterilization postpartum  Procedures-  #1 normal spontaneous delivery  #2-postpartum tubal sterilization procedure  Summary-  This 25 year old gravida 3 now para 3 with a due date of 10/2 was admitted in labor on 10/4 and delivered during the early morning of 10/5. She requested a permanent elective sterilization procedure and had discussed this in the office and signed all appropriate forms and was taken to the operating room on the morning of 10/5 for a postpartum tubal sterilization procedure using her epidural as anesthesia and the procedure went well. A CBC on the morning of 10/6 found her hemoglobin to be 10.0, and her white blood count 11,900 with 255,000 platelets.  On the morning of 10/6 the patient was ambulating well, tolerating a regular diet well, having normal bowel and bladder function, was breast-feeding without difficulty, and was desirous of discharge. Her sub-umbilical incision was clean and dry and instructions both for postpartum care and incisional care were given to the patient at the time of discharge. She did receive an office of discharge brochure and understood all instructions well.  This patient was maintained on Valtrex 500 mg daily during the latter part of her pregnancy because of a history of HSV and is known to have sickle-cell trait but the father of the baby tested with normal hemoglobin.  Followup in the office will be in approximately 4 weeks.

## 2011-01-17 ENCOUNTER — Encounter (HOSPITAL_COMMUNITY): Payer: Self-pay | Admitting: Obstetrics & Gynecology

## 2012-07-26 ENCOUNTER — Encounter: Payer: Medicaid Other | Admitting: Obstetrics & Gynecology

## 2012-07-26 DIAGNOSIS — Z01419 Encounter for gynecological examination (general) (routine) without abnormal findings: Secondary | ICD-10-CM

## 2012-08-02 ENCOUNTER — Ambulatory Visit (INDEPENDENT_AMBULATORY_CARE_PROVIDER_SITE_OTHER): Payer: No Typology Code available for payment source | Admitting: Obstetrics & Gynecology

## 2012-08-02 ENCOUNTER — Encounter: Payer: Self-pay | Admitting: Obstetrics & Gynecology

## 2012-08-02 VITALS — BP 120/85 | HR 65 | Resp 16 | Ht 65.0 in | Wt 236.0 lb

## 2012-08-02 DIAGNOSIS — Z1151 Encounter for screening for human papillomavirus (HPV): Secondary | ICD-10-CM

## 2012-08-02 DIAGNOSIS — Z113 Encounter for screening for infections with a predominantly sexual mode of transmission: Secondary | ICD-10-CM

## 2012-08-02 DIAGNOSIS — Z0000 Encounter for general adult medical examination without abnormal findings: Secondary | ICD-10-CM

## 2012-08-02 DIAGNOSIS — Z124 Encounter for screening for malignant neoplasm of cervix: Secondary | ICD-10-CM

## 2012-08-02 DIAGNOSIS — Z01419 Encounter for gynecological examination (general) (routine) without abnormal findings: Secondary | ICD-10-CM

## 2012-08-02 NOTE — Progress Notes (Signed)
Subjective:    Misty Mathis is a 27 y.o. female who presents for an annual exam. The patient has no complaints today. The patient is sexually active. GYN screening history: last pap: was normal. The patient wears seatbelts: yes. The patient participates in regular exercise: no. Has the patient ever been transfused or tattooed?: yes. The patient reports that there is not domestic violence in her life.   Menstrual History: OB History   Grav Para Term Preterm Abortions TAB SAB Ect Mult Living   3 3 3       3       Menarche age: 55  Patient's last menstrual period was 08/01/2012.    The following portions of the patient's history were reviewed and updated as appropriate: allergies, current medications, past family history, past medical history, past social history, past surgical history and problem list.  Review of Systems A comprehensive review of systems was negative.    Objective:    BP 120/85  Pulse 65  Resp 16  Ht 5\' 5"  (1.651 m)  Wt 236 lb (107.049 kg)  BMI 39.27 kg/m2  LMP 08/01/2012  Breastfeeding? No  General Appearance:    Alert, cooperative, no distress, appears stated age  Head:    Normocephalic, without obvious abnormality, atraumatic  Eyes:    PERRL, conjunctiva/corneas clear, EOM's intact, fundi    benign, both eyes  Ears:    Normal TM's and external ear canals, both ears  Nose:   Nares normal, septum midline, mucosa normal, no drainage    or sinus tenderness  Throat:   Lips, mucosa, and tongue normal; teeth and gums normal  Neck:   Supple, symmetrical, trachea midline, no adenopathy;    thyroid:  no enlargement/tenderness/nodules; no carotid   bruit or JVD  Back:     Symmetric, no curvature, ROM normal, no CVA tenderness  Lungs:     Clear to auscultation bilaterally, respirations unlabored  Chest Wall:    No tenderness or deformity   Heart:    Regular rate and rhythm, S1 and S2 normal, no murmur, rub   or gallop  Breast Exam:    No tenderness, masses, or  nipple abnormality  Abdomen:     Soft, non-tender, bowel sounds active all four quadrants,    no masses, no organomegaly  Genitalia:    Normal female without lesion, discharge or tenderness, NSSA, NT, mobile, normal adnexa, She is currently on her period.     Extremities:   Extremities normal, atraumatic, no cyanosis or edema  Pulses:   2+ and symmetric all extremities  Skin:   Skin color, texture, turgor normal, no rashes or lesions  Lymph nodes:   Cervical, supraclavicular, and axillary nodes normal  Neurologic:   CNII-XII intact, normal strength, sensation and reflexes    throughout  .    Assessment:    Healthy female exam.    Plan:     Chlamydia specimen. GC specimen. Thin prep Pap smear.

## 2012-08-28 ENCOUNTER — Encounter (HOSPITAL_BASED_OUTPATIENT_CLINIC_OR_DEPARTMENT_OTHER): Payer: Self-pay | Admitting: Emergency Medicine

## 2012-08-28 ENCOUNTER — Emergency Department (HOSPITAL_BASED_OUTPATIENT_CLINIC_OR_DEPARTMENT_OTHER)
Admission: EM | Admit: 2012-08-28 | Discharge: 2012-08-28 | Disposition: A | Discharge status: Home / Self Care | Payer: No Typology Code available for payment source | Attending: Emergency Medicine | Admitting: Emergency Medicine

## 2012-08-28 DIAGNOSIS — M5431 Sciatica, right side: Secondary | ICD-10-CM

## 2012-08-28 DIAGNOSIS — Z8619 Personal history of other infectious and parasitic diseases: Secondary | ICD-10-CM | POA: Insufficient documentation

## 2012-08-28 DIAGNOSIS — E669 Obesity, unspecified: Secondary | ICD-10-CM | POA: Insufficient documentation

## 2012-08-28 DIAGNOSIS — M5430 Sciatica, unspecified side: Secondary | ICD-10-CM | POA: Insufficient documentation

## 2012-08-28 MED ORDER — NAPROXEN 250 MG PO TABS
500.0000 mg | ORAL_TABLET | Freq: Once | ORAL | Status: AC
  Administered 2012-08-28: 500 mg via ORAL

## 2012-08-28 MED ORDER — NAPROXEN 375 MG PO TABS: 375.0000 mg | ORAL_TABLET | Freq: Two times a day (BID) | ORAL | Status: DC

## 2012-08-28 MED ORDER — HYDROCODONE-ACETAMINOPHEN 5-325 MG PO TABS: 1.0000 | ORAL_TABLET | Freq: Four times a day (QID) | ORAL | Status: DC | PRN

## 2012-08-28 MED ORDER — NAPROXEN 250 MG PO TABS
ORAL_TABLET | ORAL | Status: AC
  Administered 2012-08-28: 500 mg via ORAL
  Filled 2012-08-28: qty 2

## 2012-08-28 NOTE — ED Provider Notes (Signed)
History     CSN: [id number]  Arrival date & time 08/28/12  2046   First MD Initiated Contact with Patient 08/28/12 2300      Chief Complaint  Patient presents with  . Leg Pain    (Consider location/radiation/quality/duration/timing/severity/associated sxs/prior treatment) Patient is a 27 y.o. female presenting with leg pain. The history is provided by the patient. No language interpreter was used.  Leg Pain Location:  Hip Injury: no   Hip location:  R hip Pain details:    Quality:  Shooting   Radiates to: posterior knee.   Severity:  Moderate   Onset quality:  Gradual   Timing:  Intermittent   Progression:  Unchanged Chronicity:  New Dislocation: no   Foreign body present:  No foreign bodies Prior injury to area:  No Relieved by:  Nothing Worsened by:  Nothing tried Ineffective treatments:  None tried Associated symptoms: no back pain, no neck pain and no swelling   Risk factors: no concern for non-accidental trauma     Past Medical History  Diagnosis Date  . Abnormal Pap smear 07    colposcopy with biopsy  . Herpes simplex   . Obesity     Past Surgical History  Procedure Laterality Date  . Ankle surgery  02/2009  . Breast cyst excision  <72yr  . Tubal ligation  01/14/2011    Procedure: POST PARTUM TUBAL LIGATION;  Surgeon: Caprice Beaver;  Location: [facility];  Service: Gynecology;  Laterality: Bilateral;  . Pilonidal cyst excision      Family History  Problem Relation Age of Onset  . Cancer Mother     breast  . Cancer Maternal Aunt     colon  . Cancer Maternal Grandfather     lung  . Diabetes Maternal Grandmother     History  Substance Use Topics  . Smoking status: Never Smoker   . Smokeless tobacco: Never Used  . Alcohol Use: No    OB History   Grav Para Term Preterm Abortions TAB SAB Ect Mult Living   3 3 3       3       Review of Systems  HENT: Negative for neck pain.   Musculoskeletal: Negative for back pain.  All other systems reviewed  and are negative.    Allergies  Review of patient's allergies indicates no known allergies.  Home Medications  No current outpatient prescriptions on file.  BP 126/80  Pulse 82  Temp(Src) 99.2 F (37.3 C) (Oral)  Resp 16  Ht 5\' 5"  (1.651 m)  Wt 236 lb (107.049 kg)  BMI 39.27 kg/m2  SpO2 99%  LMP 08/01/2012  Physical Exam  Constitutional: She is oriented to person, place, and time. She appears well-developed and well-nourished. No distress.  HENT:  Head: Normocephalic and atraumatic.  Eyes: Conjunctivae are normal. Pupils are equal, round, and reactive to light.  Neck: Normal range of motion. Neck supple.  Cardiovascular: Normal rate, regular rhythm and intact distal pulses.   Pulmonary/Chest: Effort normal and breath sounds normal. No respiratory distress. She has no wheezes. She has no rales.  Abdominal: Soft. Bowel sounds are normal. There is no tenderness. There is no rebound and no guarding.  Musculoskeletal: Normal range of motion. She exhibits no edema and no tenderness.  No laxity of either knee to varus or valgus stress.  Negative anterior and posterior drawer tests B  Neurological: She is alert and oriented to person, place, and time. She has normal reflexes.  Skin: Skin is warm and dry.  Psychiatric: She has a normal mood and affect.    ED Course  Procedures (including critical care time)  Labs Reviewed - No data to display No results found.   No diagnosis found.    MDM  Sciatica.  Pain meds and nsaids        Ekta Dancer K Gurman [place]-Rasch, MD 08/28/12 2309

## 2012-08-28 NOTE — ED Notes (Signed)
MD at bedside. 

## 2012-08-28 NOTE — ED Notes (Signed)
Pain in right leg from knee to low back that started yesterday.  Getting progressively worse.  Sts she has had a similar pain before but it always "wore off". Denies injury.

## 2012-09-06 ENCOUNTER — Encounter: Payer: Self-pay | Admitting: Family Medicine

## 2012-09-06 ENCOUNTER — Ambulatory Visit (INDEPENDENT_AMBULATORY_CARE_PROVIDER_SITE_OTHER): Payer: No Typology Code available for payment source | Admitting: Family Medicine

## 2012-09-06 VITALS — BP 118/82 | HR 78 | Ht 65.0 in | Wt 236.0 lb

## 2012-09-06 DIAGNOSIS — M79609 Pain in unspecified limb: Secondary | ICD-10-CM

## 2012-09-06 DIAGNOSIS — M25559 Pain in unspecified hip: Secondary | ICD-10-CM

## 2012-09-06 DIAGNOSIS — M25572 Pain in left ankle and joints of left foot: Secondary | ICD-10-CM

## 2012-09-06 DIAGNOSIS — M25579 Pain in unspecified ankle and joints of unspecified foot: Secondary | ICD-10-CM

## 2012-09-06 DIAGNOSIS — M25551 Pain in right hip: Secondary | ICD-10-CM

## 2012-09-06 DIAGNOSIS — M79604 Pain in right leg: Secondary | ICD-10-CM

## 2012-09-06 MED ORDER — NAPROXEN 500 MG PO TABS: 500.0000 mg | ORAL_TABLET | Freq: Two times a day (BID) | ORAL | Status: DC

## 2012-09-06 MED ORDER — CYCLOBENZAPRINE HCL 10 MG PO TABS: 10.0000 mg | ORAL_TABLET | Freq: Three times a day (TID) | ORAL | Status: DC | PRN

## 2012-09-06 NOTE — Patient Instructions (Addendum)
Your history and symptoms are consistent with sciatica, less likely radiculopathy (pinched nerve in your low back). Naproxen 500mg  twice a day with food for pain and inflammation. Can consider prednisone dose pack if pain is severe. Flexeril as needed for muscle spasms (no driving on this medicine if it makes you sleepy). Stay as active as possible. Physical therapy has been shown to be helpful as well - start this and do home exercises on days you don't go to therapy. Strengthening of low back muscles, abdominal musculature are key for long term pain relief. If not improving, will consider further imaging (MRI).

## 2012-09-07 ENCOUNTER — Encounter: Payer: Self-pay | Admitting: Family Medicine

## 2012-09-07 DIAGNOSIS — M25551 Pain in right hip: Secondary | ICD-10-CM | POA: Insufficient documentation

## 2012-09-07 DIAGNOSIS — M25572 Pain in left ankle and joints of left foot: Secondary | ICD-10-CM | POA: Insufficient documentation

## 2012-09-07 NOTE — Progress Notes (Signed)
Subjective:    Patient ID: Misty Mathis, female    DOB: [birth year], 27 y.o.   MRN: [id number]  PCP: None  HPI 27 yo F here for right leg pain.  Patient states last week she woke up and had pain from right posterior knee all the way up to right buttock/hip. Had this previously but never this bad and always went away. No numbness or tingling. Went to urgent care - given nsaid and pain medication. States has eased and coming on off and on now. No bowel/bladder dysfunction. She also has questions regarding prior left ankle surgery - had a fracture that was fixed by [facility] with plates, pins, screws and feels like one of these pins or screws is coming out - has not been back to see them in 3 years.  Past Medical History  Diagnosis Date  . Abnormal Pap smear 07    colposcopy with biopsy  . Herpes simplex   . Obesity     No current outpatient prescriptions on file prior to visit.   No current facility-administered medications on file prior to visit.    Past Surgical History  Procedure Laterality Date  . Ankle surgery  02/2009  . Breast cyst excision  <36yr  . Tubal ligation  01/14/2011    Procedure: POST PARTUM TUBAL LIGATION;  Surgeon: Misty Mathis;  Location: [facility];  Service: Gynecology;  Laterality: Bilateral;  . Pilonidal cyst excision      No Known Allergies  History   Social History  . Marital Status: Married    Spouse Name: N/A    Number of Children: N/A  . Years of Education: N/A   Occupational History  . 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 Smoking status: Never Smoker   . Smokeless tobacco: Never Used  . Alcohol Use: No  . Drug Use: No  . Sexually Active: Yes -- Female partner(s)    Birth Control/ Protection: None   Other Topics Concern  . Not on file   Social History Narrative  . No narrative on file    Family History  Problem Relation Age of Onset  . Cancer Mother     breast  . Hyperlipidemia Mother   . Cancer Maternal  Aunt     colon  . Cancer Maternal Grandfather     lung  . Diabetes Maternal Grandmother   . Hypertension Brother   . Heart attack Neg Hx     BP 118/82  Pulse 78  Ht 5\' 5"  (1.651 m)  Wt 236 lb (107.049 kg)  BMI 39.27 kg/m2  Review of Systems See HPI above.    Objective:   Physical Exam Gen: NAD  Back: No gross deformity, scoliosis. No paraspinal, buttock, hip TTP .  No midline or bony TTP. FROM without pain. Strength LEs 5/5 all muscle groups.   2+ MSRs in patellar and achilles tendons, equal bilaterally. Negative SLRs. Sensation intact to light touch bilaterally. Negative logroll bilateral hips Negative fabers and piriformis stretches.  L ankle: Well healed surgical scar laterally. Can feel a pin or screw more prominently than other parts of lower leg - states this is where she is having problems.    Assessment & Plan:  1. Right hip pain - consistent with intermittent sciatica.  Start with naproxen, flexeril as needed and physical therapy/home exercise program.  Consider prednisone if worsens instead of improves.  2. Left ankle pain - advised to return to her surgeon at  [facility] to discuss removal of this pin or screw as it is bothering her.

## 2012-09-07 NOTE — Assessment & Plan Note (Signed)
advised to return to her surgeon at [facility] to discuss removal of this pin or screw as it is bothering her.

## 2012-09-07 NOTE — Assessment & Plan Note (Signed)
consistent with intermittent sciatica.  Start with naproxen, flexeril as needed and physical therapy/home exercise program.  Consider prednisone if worsens instead of improves.

## 2012-09-12 ENCOUNTER — Ambulatory Visit: Payer: No Typology Code available for payment source | Attending: Family Medicine | Admitting: Rehabilitation

## 2013-03-05 ENCOUNTER — Other Ambulatory Visit: Payer: Self-pay | Admitting: Obstetrics & Gynecology

## 2013-06-03 DIAGNOSIS — T8484XA Pain due to internal orthopedic prosthetic devices, implants and grafts, initial encounter: Secondary | ICD-10-CM | POA: Insufficient documentation

## 2013-06-17 ENCOUNTER — Encounter (INDEPENDENT_AMBULATORY_CARE_PROVIDER_SITE_OTHER): Payer: Self-pay | Admitting: Surgery

## 2013-06-17 ENCOUNTER — Ambulatory Visit (INDEPENDENT_AMBULATORY_CARE_PROVIDER_SITE_OTHER): Payer: 59 | Admitting: Surgery

## 2013-06-17 VITALS — BP 150/90 | HR 82 | Temp 98.4°F | Resp 12 | Ht 65.0 in | Wt 251.4 lb

## 2013-06-17 DIAGNOSIS — K625 Hemorrhage of anus and rectum: Secondary | ICD-10-CM | POA: Insufficient documentation

## 2013-06-17 DIAGNOSIS — Z800 Family history of malignant neoplasm of digestive organs: Secondary | ICD-10-CM

## 2013-06-17 NOTE — Progress Notes (Signed)
Subjective:     Patient ID: Misty Mathis, female   DOB: 04-08-1986, 27 y.o.   MRN: [id number]  HPI  Note: This dictation was prepared with Dragon/digital dictation along with [facility] technology. Any transcriptional errors that result from this process are unintentional.       KRISTAIN FILO  [birth year] [id number]  Patient Care Team: Annamaria Helling, MD as PCP - General (Obstetrics and Gynecology)  This patient is a 28 y.o.female who presents today for surgical evaluation at the request of Dr. Aldona Bar.   Reason for visit: Rectal bleeding.  Probable hemorrhoids.  Pleasant obese female who was noticed blood clots within her bowel movements for the past month.  She saw her gynecologist and function tests her primary care physician.  Suspicion of hemorrhoids.  Given a suppository.  That seemed to help a little bit but then came right back.  She has bowel movements about every other day.  Occasionally hard.   No pain with bowel movements.  No drainage.  No history of fissures or fistulae.  No prior issues with hemorrhoids or anal rectal issues that she can recall.  No personal history of GI/colon cancer, inflammatory bowel disease, irritable bowel syndrome, allergy such as Celiac Sprue, dietary/dairy problems, colitis, ulcers nor gastritis.  She tells me that her mother's sister (maternal aunt) was diagnosed with metastatic colon cancer age 108.  No recent sick contacts/gastroenteritis.  No travel outside the country.  No changes in diet.  No dysphagia to solids or liquids.  No significant heartburn or reflux.  No hematochezia, hematemesis, coffee ground emesis.  No evidence of prior gastric/peptic ulceration.  Because of concern of rectal bleeding and probable hemorrhoids, surgical consultation requested  Patient Active Problem List   Diagnosis Date Noted  . Obesity, Class III, BMI 40-49.9 (morbid obesity) 06/17/2013  . Rectal bleeding 06/17/2013  . Family history of colon cancer -  Maternal Aunt with metastatic colon cancer age 30 06/17/2013  . Right hip pain 09/07/2012  . Left ankle pain 09/07/2012  . Herpes simplex     Past Medical History  Diagnosis Date  . Abnormal Pap smear 07    colposcopy with biopsy  . Herpes simplex   . Obesity   . Anemia     Past Surgical History  Procedure Laterality Date  . Ankle surgery  02/2009  . Breast cyst excision  <62yr  . Tubal ligation  01/14/2011    Procedure: POST PARTUM TUBAL LIGATION;  Surgeon: Caprice Beaver;  Location: [facility];  Service: Gynecology;  Laterality: Bilateral;  . Pilonidal cyst excision      History   Social History  . Marital Status: Married    Spouse Name: N/A    Number of Children: N/A  . Years of Education: N/A   Occupational History  . 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 Smoking status: Never Smoker   . Smokeless tobacco: Never Used  . Alcohol Use: No  . Drug Use: No  . Sexual Activity: Yes    Partners: Male    Birth Control/ Protection: None   Other Topics Concern  . Not on file   Social History Narrative  . No narrative on file    Family History  Problem Relation Age of Onset  . Breast cancer Mother     breast  . Hyperlipidemia Mother   . Colon cancer Maternal Aunt 34    colon  . Lung cancer Maternal Grandfather  lung  . Diabetes Maternal Grandmother   . Hypertension Brother   . Heart attack Neg Hx     Current Outpatient Prescriptions  Medication Sig Dispense Refill  . Diethylpropion HCl CR 75 MG TB24 Take by mouth.       No current facility-administered medications for this visit.     No Known Allergies  BP 150/90  Pulse 82  Temp(Src) 98.4 F (36.9 C)  Resp 12  Ht 5\' 5"  (1.651 m)  Wt 251 lb 6.4 oz (114.034 kg)  BMI 41.84 kg/m2  No results found.   Review of Systems  Constitutional: Negative for fever, chills, diaphoresis, appetite change and fatigue.  HENT: Negative for ear discharge, ear pain, sore throat and trouble  swallowing.   Eyes: Negative for photophobia, discharge and visual disturbance.  Respiratory: Negative for cough, choking, chest tightness and shortness of breath.   Cardiovascular: Negative for chest pain and palpitations.  Gastrointestinal: Positive for blood in stool and anal bleeding. Negative for nausea, vomiting, abdominal pain, diarrhea and rectal pain.  Endocrine: Negative for cold intolerance and heat intolerance.  Genitourinary: Negative for dysuria, frequency and difficulty urinating.  Musculoskeletal: Negative for gait problem, myalgias and neck pain.  Skin: Negative for color change, pallor and rash.  Allergic/Immunologic: Negative for environmental allergies, food allergies and immunocompromised state.  Neurological: Negative for dizziness, speech difficulty, weakness and numbness.  Hematological: Negative for adenopathy.  Psychiatric/Behavioral: Negative for confusion and agitation. The patient is not nervous/anxious.        Objective:   Physical Exam  Constitutional: She is oriented to person, place, and time. She appears well-developed and well-nourished. No distress.  HENT:  Head: Normocephalic.  Mouth/Throat: Oropharynx is clear and moist. No oropharyngeal exudate.  Eyes: Conjunctivae and EOM are normal. Pupils are equal, round, and reactive to light. No scleral icterus.  Neck: Normal range of motion. Neck supple. No tracheal deviation present.  Cardiovascular: Normal rate, regular rhythm and intact distal pulses.   Pulmonary/Chest: Effort normal and breath sounds normal. No stridor. No respiratory distress. She exhibits no tenderness.  Abdominal: Soft. She exhibits no distension and no mass. There is no tenderness. A hernia is present. Hernia confirmed positive in the ventral area. Hernia confirmed negative in the right inguinal area and confirmed negative in the left inguinal area.    Genitourinary: No vaginal discharge found.  Exam done with assistance of female  Medical Assistant in the room.  Perianal skin clean with good hygiene.  No pruritis.  No pilonidal disease.  No fissure.  No abscess/fistula.  Smal R posterior external hemorrhoid.  Normal sphincter tone.  Tolerates digital  and anoscopic rectal exam.  No rectal masses.  Hemorrhoidal piles WNL.  No ulceration/blood   Musculoskeletal: Normal range of motion. She exhibits no tenderness.       Right elbow: She exhibits normal range of motion.       Left elbow: She exhibits normal range of motion.       Right wrist: She exhibits normal range of motion.       Left wrist: She exhibits normal range of motion.       Right hand: Normal strength noted.       Left hand: Normal strength noted.  Lymphadenopathy:       Head (right side): No posterior auricular adenopathy present.       Head (left side): No posterior auricular adenopathy present.    She has no cervical adenopathy.    She has no  axillary adenopathy.       Right: No inguinal adenopathy present.       Left: No inguinal adenopathy present.  Neurological: She is alert and oriented to person, place, and time. No cranial nerve deficit. She exhibits normal muscle tone. Coordination normal.  Skin: Skin is warm and dry. No rash noted. She is not diaphoretic. No erythema.  Psychiatric: She has a normal mood and affect. Her behavior is normal. Judgment and thought content normal.       Assessment:     Rectal bleeding in a patient with an Aunt diagnosed with metastatic colon cancer age 30.  Minimal hemorrhoidal disease     Plan:     Her hemorrhoid disease seems rather underwhelming.  She has a history of Maternal Aunt with metastatic colon cancer age 105> She has bleeding clots within bowel movements, The typical story of a little mild blood with wiping only.  I think we need to disprove any polyp or tumor.  Set up for colonoscopy.    Is reasonable to put her on a fiber supplement for her mild constipation.  I do not see anything that makes me  feel like she would benefit from having banding or injection at this time.  If her colonoscopy is completely normal and still having issues despite good fiber bowel regimen, I may reconsider.  Hold off for now. No other symptoms.  Mild external hemorrhoids does not seem to bother her, so I would leave that alone.  The anatomy & physiology of the anorectal region was discussed.  The pathophysiology of hemorrhoids and differential diagnosis was discussed.  Natural history progression  was discussed.   I stressed the importance of a bowel regimen to have daily soft bowel movements to minimize progression of disease.   Goal of one BM / day ideal.  Use of wet wipes, warm baths, avoiding straining, etc were emphasized.  Educational handouts further explaining the pathology, treatment options, and bowel regimen were given as well.   The patient expressed understanding.

## 2013-06-17 NOTE — Patient Instructions (Signed)
With the rectal bleeding and family history of colon cancer, I think you would benefit from colonoscopy to make sure there is not a polyp or other abnormality in her colon.  Rectal Bleeding Rectal bleeding is when blood passes out of the anus. It is usually a sign that something is wrong. It may not be serious, but it should always be evaluated. Rectal bleeding may present as bright red blood or extremely dark stools. The color may range from dark red or maroon to black (like tar). It is important that the cause of rectal bleeding be identified so treatment can be started and the problem corrected. CAUSES   Hemorrhoids. These are enlarged (dilated) blood vessels or veins in the anal or rectal area.  Fistulas. Theseare abnormal, burrowing channels that usually run from inside the rectum to the skin around the anus. They can bleed.  Anal fissures. This is a tear in the tissue of the anus. Bleeding occurs with bowel movements.  Diverticulosis. This is a condition in which pockets or sacs project from the bowel wall. Occasionally, the sacs can bleed.  Diverticulitis. Thisis an infection involving diverticulosis of the colon.  Proctitis and colitis. These are conditions in which the rectum, colon, or both, can become inflamed and pitted (ulcerated).  Polyps and cancer. Polyps are non-cancerous (benign) growths in the colon that may bleed. Certain types of polyps turn into cancer.  Protrusion of the rectum. Part of the rectum can project from the anus and bleed.  Certain medicines.  Intestinal infections.  Blood vessel abnormalities. HOME CARE INSTRUCTIONS  Eat a high-fiber diet to keep your stool soft.  Limit activity.  Drink enough fluids to keep your urine clear or pale yellow.  Warm baths may be useful to soothe rectal pain.  Follow up with your caregiver as directed. SEEK IMMEDIATE MEDICAL CARE IF:  You develop increased bleeding.  You have black or dark red stools.  You  vomit blood or material that looks like coffee grounds.  You have abdominal pain or tenderness.  You have a fever.  You feel weak, nauseous, or you faint.  You have severe rectal pain or you are unable to have a bowel movement. MAKE SURE YOU:  Understand these instructions.  Will watch your condition.  Will get help right away if you are not doing well or get worse. Document Released: 09/17/2001 Document Revised: 06/20/2011 Document Reviewed: 09/12/2010 [facility] Patient Information 2014 [place], [place].   Start a Fiber bowel regimen to treat your mild hemorrhoid disease.  A.  Colonoscopy is normal in newer having better bowel movements but still bleeding, I may consider injection or banding of your hemorrhoids:  HEMORRHOIDS  The rectum is the last foot of your colon, and it naturally stretches to hold stool.  Hemorrhoidal piles are natural clusters of blood vessels that help the rectum and anal canal stretch to hold stool and allow bowel movements to eliminate feces.   Hemorrhoids are abnormally swollen blood vessels in the rectum.  Too much pressure in the rectum causes hemorrhoids by forcing blood to stretch and bulge the walls of the veins, sometimes even rupturing them.  Hemorrhoids can become like varicose veins you might see on a person's legs.  Most people will develop a flare of hemorrhoids in their lifetime.  When bulging hemorrhoidal veins are irritated, they can swell, burn, itch, cause pain, and bleed.  Most flares will calm down gradually own within a few weeks.  However, once hemorrhoids are created, they are  difficult to get rid of completely and tend to flare more easily than the first flare.   Fortunately, good habits and simple medical treatment usually control hemorrhoids well, and surgery is needed only in severe cases. Types of Hemorrhoids:  Internal hemorrhoids usually don't initially hurt or itch; they are deep inside the rectum and usually have no sensation. If they  begin to push out (prolapse), pain and burning can occur.  However, internal hemorrhoids can bleed.  Anal bleeding should not be ignored since bleeding could come from a dangerous source like colorectal cancer, so persistent rectal bleeding should be investigated by a doctor, sometimes with a colonoscopy.  External hemorrhoids cause most of the symptoms - pain, burning, and itching. Nonirritated hemorrhoids can look like small skin tags coming out of the anus.   Thrombosed hemorrhoids can form when a hemorrhoid blood vessel bursts and causes the hemorrhoid to suddenly swell.  A purple blood clot can form in it and become an excruciatingly painful lump at the anus. Because of these unpleasant symptoms, immediate incision and drainage by a surgeon at an office visit can provide much relief of the pain.    PREVENTION Avoiding the most frequent causes listed below will prevent most cases of hemorrhoids: Constipation Hard stools Diarrhea  Constant sitting  Straining with bowel movements Sitting on the toilet for a long time  Severe coughing  episodes Pregnancy / Childbirth  Heavy Lifting  Sometimes avoiding the above triggers is difficult:  How can you avoid sitting all day if you have a seated job? Also, we try to avoid coughing and diarrhea, but sometimes it's beyond your control.  Still, there are some practical hints to help: Keep the anal and genital area clean.  Moistened tissues such as flushable wet wipes are less irritating than toilet paper.  Using irrigating showers or bottle irrigation washing gently cleans this sensitive area.   Avoid dry toilet paper when cleaning after bowel movements.  Marland Kitchen Keep the anal and genital area dry.  Lightly pat the rectal area dry.  Avoid rubbing.  Talcum or baby powders can help GET YOUR STOOLS SOFT.   This is the most important way to prevent irritated hemorrhoids.  Hard stools are like sandpaper to the anorectal canal and will cause more problems.  The goal:  ONE SOFT BOWEL MOVEMENT A DAY!  BMs from every other day to 3 times a day is a tolerable range Treat coughing, diarrhea and constipation early since irritated hemorrhoids may soon follow.  If your main job activity is seated, always stand or walk during your breaks. Make it a point to stand and walk at least 5 minutes every hour and try to shift frequently in your chair to avoid direct rectal pressure.  Always exhale as you strain or lift. Don't hold your breath.  Do not delay or try to prevent a bowel movement when the urge is present. Exercise regularly (walking or jogging 60 minutes a day) to stimulate the bowels to move. No reading or other activity while on the toilet. If bowel movements take longer than 5 minutes, you are too constipated. AVOID CONSTIPATION Drink plenty of liquids (1 1/2 to 2 quarts of water and other fluids a day unless fluid restricted for another medical condition). Liquids that contain caffeine (coffee a, tea, soft drinks) can be dehydrating and should be avoided until constipation is controlled. Consider minimizing milk, as dairy products may be constipating. Eat plenty of fiber (30g a day ideal, more if needed).  Fiber is the undigested part of plant food that passes into the colon, acting as "natures broom" to encourage bowel motility and movement.  Fiber can absorb and hold large amounts of water. This results in a larger, bulkier stool, which is soft and easier to pass.  Eating foods high in fiber - 12 servings - such as  Vegetables: Root (potatoes, carrots, turnips), Leafy green (lettuce, salad greens, celery, spinach), High residue (cabbage, broccoli, etc.) Fruit: Fresh, Dried (prunes, apricots, cherries), Stewed (applesauce)  Whole grain breads, pasta, whole wheat Bran cereals, muffins, etc. Consider adding supplemental bulking fiber which retains large volumes of water: Psyllium ground seeds (native plant from [place])--available as Metamucil, Konsyl,  Effersyllium, Per Diem Fiber, or the less expensive generic forms.  Citrucel  (methylcellulose wood fiber) . FiberCon (Polycarbophil) Polyethylene Glycol - and "artificial" fiber commonly called Miralax or Glycolax.  It is helpful for people with gassy or bloated feelings with regular fiber Flax Seed - a less gassy natural fiber  Laxatives can be useful for a short period if constipation is severe Osmotics (Milk of Magnesia, Fleets Phospho-Soda, Magnesium Citrate)  Stimulants (Senokot,   Castor Oil,  Dulcolax, Ex-Lax)    Laxatives are not a good long-term solution as it can stress the bowels and cause too much mineral loss and dehydration.   Avoid taking laxatives for more than 7 days in a row.  AVOID DIARRHEA Switch to liquids and simpler foods for a few days to avoid stressing your intestines further. Avoid dairy products (especially milk & ice cream) for a short time.  The intestines often can lose the ability to digest lactose when stressed. Avoid foods that cause gassiness or bloating.  Typical foods include beans and other legumes, cabbage, broccoli, and dairy foods.  Every person has some sensitivity to other foods, so listen to your body and avoid those foods that trigger problems for you. Adding fiber (Citrucel, Metamucil, FiberCon, Flax seed, Miralax) gradually can help thicken stools by absorbing excess fluid and retrain the intestines to act more normally.  Slowly increase the dose over a few weeks.  Too much fiber too soon can backfire and cause cramping & bloating. Probiotics (such as active yogurt, Align, etc) may help repopulate the intestines and colon with normal bacteria and calm down a sensitive digestive tract.  Most studies show it to be of mild help, though, and such products can be costly. Medicines: Bismuth subsalicylate (ex. Kayopectate, Pepto Bismol) every 30 minutes for up to 6 doses can help control diarrhea.  Avoid if pregnant. Loperamide (Immodium) can slow down  diarrhea.  Start with two tablets (4mg  total) first and then try one tablet every 6 hours.  Avoid if you are having fevers or severe pain.  If you are not better or start feeling worse, stop all medicines and call your doctor for advice Call your doctor if you are getting worse or not better.  Sometimes further testing (cultures, endoscopy, X-ray studies, bloodwork, etc) may be needed to help diagnose and treat the cause of the diarrhea. TREATMENT OF HEMORRHOID FLARE If these preventive measures fail, you must take action right away! Hemorrhoids are one condition that can be mild in the morning and become intolerable by nightfall. Most hemorrhoidal flares take several weeks to calm down.  These suggestions can help: Warm soaks.  This helps more than any topical medication.  Use up to 8 times a day.  Usually sitz baths or sitting in a warm bathtub helps.  Sitting on  moist warm towels are helpful.  Switching to ice packs/cool compresses can be helpful  Use a Sitz Bath 4-8 times a day for relief A sitz bath is a warm water bath taken in the sitting position that covers only the hips and buttocks. It may be used for either healing or hygiene purposes. Sitz baths are also used to relieve pain, itching, or muscle spasms. The water may contain medicine. Moist heat will help you heal and relax.  HOME CARE INSTRUCTIONS  Take 3 to 4 sitz baths a day. 1. Fill the bathtub half full with warm water. 2. Sit in the water and open the drain a little. 3. Turn on the warm water to keep the tub half full. Keep the water running constantly. 4. Soak in the water for 15 to 20 minutes. 5. After the sitz bath, pat the affected area dry first. SEEK MEDICAL CARE IF:  You get worse instead of better. Stop the sitz baths if you get worse.  Normalize your bowels.  Extremes of diarrhea or constipation will make hemorrhoids worse.  One soft bowel movement a day is the goal.  Fiber can help get your bowels regular Wet wipes  instead of toilet paper Pain control with a NSAID such as ibuprofen (Advil) or naproxen (Aleve) or acetaminophen (Tylenol) around the clock.  Narcotics are constipating and should be minimized if possible Topical creams contain steroids (bydrocortisone) or local anesthetic (xylocaine) can help make pain and itching more tolerable.   EVALUATION If hemorrhoids are still causing problems, you could benefit by an evaluation by a surgeon.  The surgeon will obtain a history and examine you.  If hemorrhoids are diagnosed, some therapies can be offered in the office, usually with an anoscope into the less sensitive area of the rectum: -injection of hemorrhoids (sclerotherapy) can scar the blood vessels of the swollen/enlarged hemorrhoids to help shrink them down to a more normal size -rubber banding of the enlarged hemorrhoids to help shrink them down to a more normal size -drainage of the blood clot causing a thrombosed hemorrhoid,  to relieve the severe pain   While 90% of the time such problems from hemorrhoids can be managed without preceding to surgery, sometimes the hemorrhoids require a operation to control the problem (uncontrolled bleeding, prolapse, pain, etc.).   This involves being placed under general anesthesia where the surgeon can confirm the diagnosis and remove, suture, or staple the hemorrhoid(s).  Your surgeon can help you treat the problem appropriately.

## 2013-06-18 ENCOUNTER — Encounter: Payer: Self-pay | Admitting: Internal Medicine

## 2013-06-27 ENCOUNTER — Emergency Department (HOSPITAL_BASED_OUTPATIENT_CLINIC_OR_DEPARTMENT_OTHER)
Admission: EM | Admit: 2013-06-27 | Discharge: 2013-06-27 | Disposition: A | Discharge status: Home / Self Care | Payer: 59 | Attending: Emergency Medicine | Admitting: Emergency Medicine

## 2013-06-27 ENCOUNTER — Encounter (HOSPITAL_BASED_OUTPATIENT_CLINIC_OR_DEPARTMENT_OTHER): Payer: Self-pay | Admitting: Emergency Medicine

## 2013-06-27 DIAGNOSIS — E669 Obesity, unspecified: Secondary | ICD-10-CM | POA: Insufficient documentation

## 2013-06-27 DIAGNOSIS — L509 Urticaria, unspecified: Secondary | ICD-10-CM | POA: Insufficient documentation

## 2013-06-27 DIAGNOSIS — Z79899 Other long term (current) drug therapy: Secondary | ICD-10-CM | POA: Insufficient documentation

## 2013-06-27 DIAGNOSIS — Z862 Personal history of diseases of the blood and blood-forming organs and certain disorders involving the immune mechanism: Secondary | ICD-10-CM | POA: Insufficient documentation

## 2013-06-27 DIAGNOSIS — Z8619 Personal history of other infectious and parasitic diseases: Secondary | ICD-10-CM | POA: Insufficient documentation

## 2013-06-27 MED ORDER — FAMOTIDINE 20 MG PO TABS: 20.0000 mg | ORAL_TABLET | Freq: Two times a day (BID) | ORAL | Status: DC

## 2013-06-27 MED ORDER — PREDNISONE 20 MG PO TABS: 60.0000 mg | ORAL_TABLET | Freq: Every day | ORAL | Status: DC

## 2013-06-27 NOTE — ED Provider Notes (Signed)
CSN: [id number]     Arrival date & time 06/27/13  2017 History   First MD Initiated Contact with Patient 06/27/13 2035     Chief Complaint  Patient presents with  . Rash     (Consider location/radiation/quality/duration/timing/severity/associated sxs/prior Treatment) Patient is a 28 y.o. female presenting with rash. The history is provided by the patient. No language interpreter was used.  Rash Location:  Shoulder/arm Shoulder/arm rash location:  L arm and R arm Associated symptoms: no fever and no shortness of breath   Associated symptoms comment:  Rash that come and goes to arms bilaterally that itches. No SOB, oral swelling or difficulty swallowing.    Past Medical History  Diagnosis Date  . Abnormal Pap smear 07    colposcopy with biopsy  . Herpes simplex   . Obesity   . Anemia    Past Surgical History  Procedure Laterality Date  . Ankle surgery  02/2009  . Breast cyst excision  <233yr  . Tubal ligation  01/14/2011    Procedure: POST PARTUM TUBAL LIGATION;  Surgeon: Caprice Beaverobert M Wein;  Location: [facility];  Service: Gynecology;  Laterality: Bilateral;  . Pilonidal cyst excision     Family History  Problem Relation Age of Onset  . Breast cancer Mother     breast  . Hyperlipidemia Mother   . Colon cancer Maternal Aunt 34    colon  . Lung cancer Maternal Grandfather     lung  . Diabetes Maternal Grandmother   . Hypertension Brother   . Heart attack Neg Hx    History  Substance Use Topics  . Smoking status: Never Smoker   . Smokeless tobacco: Never Used  . Alcohol Use: No   OB History   Grav Para Term Preterm Abortions TAB SAB Ect Mult Living   3 3 3       3      Review of Systems  Constitutional: Negative for fever and chills.  HENT: Negative for trouble swallowing.   Respiratory: Negative.  Negative for shortness of breath.   Skin: Positive for rash.  Neurological: Negative.       Allergies  Review of patient's allergies indicates no known  allergies.  Home Medications   Current Outpatient Rx  Name  Route  Sig  Dispense  Refill  . Diethylpropion HCl CR 75 MG TB24   Oral   Take by mouth.          BP 140/97  Pulse 83  Temp(Src) 99.4 F (37.4 C) (Oral)  Resp 18  Ht 5\' 5"  (1.651 m)  Wt 226 lb (102.513 kg)  BMI 37.61 kg/m2  SpO2 100%  LMP 06/08/2013 Physical Exam  Constitutional: She is oriented to person, place, and time. She appears well-developed and well-nourished.  Neck: Normal range of motion.  Pulmonary/Chest: Effort normal.  Neurological: She is alert and oriented to person, place, and time.  Skin: Skin is warm and dry.  Few area of raised red welts c/w residual hives.    ED Course  Procedures (including critical care time) Labs Review Labs Reviewed - No data to display Imaging Review No results found.   EKG Interpretation None      MDM   Final diagnoses:  None    1. Hives  Will add prednisone and pepcid to benadryl regimen and encourage PCP follow up if symptoms persist.     Arnoldo HookerShari A Demarkis Gheen, PA-C 06/27/13 2124

## 2013-06-27 NOTE — ED Notes (Signed)
C/o generalized rash x 5 days

## 2013-06-27 NOTE — Discharge Instructions (Signed)

## 2013-06-27 NOTE — ED Provider Notes (Signed)
Medical screening examination/treatment/procedure(s) were performed by non-physician practitioner and as supervising physician I was immediately available for consultation/collaboration.  Naelani Lafrance, MD 06/27/13 2134 

## 2013-07-18 ENCOUNTER — Other Ambulatory Visit: Payer: Self-pay | Admitting: Obstetrics & Gynecology

## 2013-07-25 ENCOUNTER — Encounter: Payer: Self-pay | Admitting: Internal Medicine

## 2013-07-30 ENCOUNTER — Telehealth: Payer: Self-pay | Admitting: Internal Medicine

## 2013-07-30 ENCOUNTER — Ambulatory Visit: Payer: No Typology Code available for payment source | Admitting: Internal Medicine

## 2013-07-30 NOTE — Telephone Encounter (Signed)
Charge per Dr. Rhea BeltonPyrtle

## 2013-07-30 NOTE — Telephone Encounter (Signed)
Yes charge per Dr. Rhea BeltonPyrtle

## 2013-09-17 ENCOUNTER — Encounter: Payer: Self-pay | Admitting: Internal Medicine

## 2013-09-18 ENCOUNTER — Ambulatory Visit: Payer: No Typology Code available for payment source | Admitting: Internal Medicine

## 2014-02-10 ENCOUNTER — Encounter (HOSPITAL_BASED_OUTPATIENT_CLINIC_OR_DEPARTMENT_OTHER): Payer: Self-pay | Admitting: Emergency Medicine

## 2014-04-11 HISTORY — PX: BREAST CYST EXCISION: SHX579

## 2014-04-22 ENCOUNTER — Other Ambulatory Visit: Payer: Self-pay | Admitting: Obstetrics & Gynecology

## 2014-04-23 LAB — CYTOLOGY - PAP

## 2015-04-12 HISTORY — PX: OTHER SURGICAL HISTORY: SHX169

## 2015-09-11 ENCOUNTER — Other Ambulatory Visit: Payer: Self-pay | Admitting: Obstetrics & Gynecology

## 2015-12-24 ENCOUNTER — Ambulatory Visit: Payer: 59 | Attending: Orthopedic Surgery | Admitting: Physical Therapy

## 2015-12-24 DIAGNOSIS — M6281 Muscle weakness (generalized): Secondary | ICD-10-CM

## 2015-12-24 DIAGNOSIS — R293 Abnormal posture: Secondary | ICD-10-CM

## 2015-12-24 DIAGNOSIS — M25611 Stiffness of right shoulder, not elsewhere classified: Secondary | ICD-10-CM | POA: Diagnosis present

## 2015-12-24 DIAGNOSIS — M25511 Pain in right shoulder: Secondary | ICD-10-CM

## 2015-12-24 NOTE — Therapy (Signed)
[facility] Outpatient Rehabilitation [facility] [address]  [address] [place], [place], [address] Phone: [phone]   Fax:  [phone]  Physical Therapy Evaluation  Patient Details  Name: NINETTE COTTA MRN: [id number] Date of Birth: [birth year] Referring Provider: Dr. Duwayne Heck  Encounter Date: 12/24/2015      PT End of Session - 12/24/15 1437    Visit Number 1   Number of Visits 12   Date for PT Re-Evaluation 02/04/16   PT Start Time 1400   PT Stop Time 1435   PT Time Calculation (min) 35 min   Activity Tolerance Patient tolerated treatment well   Behavior During Therapy [facility] for tasks assessed/performed      Past Medical History:  Diagnosis Date  . Abnormal Pap smear 07   colposcopy with biopsy  . Anemia   . Herpes simplex   . Obesity     Past Surgical History:  Procedure Laterality Date  . ANKLE SURGERY  02/2009  . BREAST CYST EXCISION  <71yr  . PILONIDAL CYST EXCISION    . TUBAL LIGATION  01/14/2011   Procedure: POST PARTUM TUBAL LIGATION;  Surgeon: Caprice Beaver;  Location: [facility];  Service: Gynecology;  Laterality: Bilateral;    There were no vitals filed for this visit.       Subjective Assessment - 12/24/15 1403    Subjective Pt is a 30 y/o female who presents to OPPT for R shoulder pain x 3-4 weeks ago.  Pt reports she had lymph node removed around the same time but MD feels this is unrelated.  Pt did receive injection at MD office   Limitations Lifting;House hold activities   Diagnostic tests x rays: bursitis   Patient Stated Goals improve pain and motion   Currently in Pain? Yes   Pain Score 5    Pain Location Shoulder   Pain Orientation Right   Pain Descriptors / Indicators Aching;Throbbing  "rubbing"   Pain Type --  subacute   Pain Radiating Towards upper arm and across clavicle   Pain Onset 1 to 4 weeks ago   Pain Frequency Constant   Aggravating Factors  overuse, overhead lifting and reaching behind back   Effect of Pain on Daily Activities difficulty with dressing            [facility] PT Assessment - 12/24/15 1407      Assessment   Medical Diagnosis R shoulder subacromial bursitis   Referring Provider Dr. Duwayne Heck   Onset Date/Surgical Date --  3-4 weeks ago   Hand Dominance Right   Next MD Visit Dec 2017   Prior Therapy none     Precautions   Precautions None     Restrictions   Weight Bearing Restrictions No     Balance Screen   Has the patient fallen in the past 6 months No   Has the patient had a decrease in activity level because of a fear of falling?  No   Is the patient reluctant to leave their home because of a fear of falling?  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; has some flexibility to change positions  Leisure spend time with children, watch TV     Cognition   Overall Cognitive Status Within Functional Limits for tasks assessed     Observation/Other Assessments   Focus on Therapeutic Outcomes (FOTO)  54 (46% limited; predicted 25% limited)     Posture/Postural Control   Posture/Postural Control Postural limitations   Postural Limitations Rounded Shoulders;Forward head     AROM   AROM Assessment Site Shoulder   Right Shoulder Flexion 128 Degrees   Right Shoulder ABduction 125 Degrees   Right Shoulder Internal Rotation 80 Degrees  FIR to T12 with pain   Right Shoulder External Rotation 90 Degrees  FER with pain and tightness   Left Shoulder Flexion 160 Degrees   Left Shoulder ABduction 180 Degrees   Left Shoulder Internal Rotation 90 Degrees   Left Shoulder External Rotation 90 Degrees     PROM   PROM Assessment Site Shoulder    Right/Left Shoulder Right   Right Shoulder Flexion 142 Degrees   Right Shoulder ABduction 157 Degrees   Right Shoulder Internal Rotation 85 Degrees     Strength   Strength Assessment Site Shoulder;Elbow   Right Shoulder Flexion 3/5   Right Shoulder ABduction 3/5   Right Shoulder Internal Rotation 4/5   Right Shoulder External Rotation 3+/5   Left Shoulder Flexion 5/5   Left Shoulder ABduction 5/5   Left Shoulder Internal Rotation 5/5   Left Shoulder External Rotation 5/5   Right/Left Elbow Right;Left   Right Elbow Flexion 5/5   Right Elbow Extension 5/5   Left Elbow Flexion 5/5   Left Elbow Extension 5/5     Palpation   Palpation comment tenderness along proximal biceps tendon, subscap tendon and supraspinatus tendon     Special Tests    Special Tests Rotator Cuff Impingement   Rotator Cuff Impingment tests Leanord AsalHawkins- Kennedy test     Hawkins-Kennedy test   Findings Negative   Side Right                   [facility] Adult PT Treatment/Exercise - 12/24/15 1407      Exercises   Exercises Shoulder     Shoulder Exercises: Standing   Flexion Both;10 reps   Flexion Limitations with cane   ABduction Right;10 reps   ABduction Limitations with cane     Shoulder Exercises: Stretch   Internal Rotation Stretch 3 reps   Internal Rotation Stretch Limitations 30 sec; standing with towel                PT Education - 12/24/15 1436    Education provided Yes   Education Details clinical findings, POC, HEP   Person(s) Educated Patient   Methods Explanation;Handout   Comprehension Verbalized understanding;Returned demonstration;Need further instruction             PT Long Term Goals - 12/24/15 1441      PT LONG TERM GOAL #1   Title independent with HEP (02/04/16)   Time 6   Period Weeks   Status New     PT LONG TERM GOAL #2   Title improve R shoulder functional internal rotation to at least T8 for improved function and donning of clothes (02/04/16)    Time 6   Period Weeks   Status New     PT LONG TERM GOAL #3   Title report pain < 2/10 with activity for improved function (02/04/16)   Time 6   Period Weeks   Status New  PT LONG TERM GOAL #4   Title improve R shoulder active abduction to 160 degrees for improved function and mobility (02/04/16)   Time 6   Period Weeks   Status New     PT LONG TERM GOAL #5   Title demonstrate 4/5 R shoulder strength without pain for improved function (02/04/16)   Time 6   Period Weeks   Status New               Plan - 12/24/15 1437    Clinical Impression Statement Pt is a 30 y/o female who presents to OPPT for R shoulder pain.  Pt demonstrates decreased ROM, strength and postural abnormalities affecting work and ADLs.  Pt will benefit from PT to address deficits listed.   Rehab Potential Good   PT Frequency 2x / week   PT Duration 6 weeks   PT Treatment/Interventions ADLs/Self Care Home Management;Cryotherapy;Electrical Stimulation;Therapeutic exercise;Iontophoresis 4mg /ml Dexamethasone;Neuromuscular re-education;Moist Heat;Ultrasound;Therapeutic activities;Patient/family education;Manual techniques;Passive range of motion;Vasopneumatic Device;Taping;Dry needling   PT Next Visit Plan review HEP, progress ROM and begin theraband strengthening   Consulted and Agree with Plan of Care Patient      Patient will benefit from skilled therapeutic intervention in order to improve the following deficits and impairments:  Decreased scar mobility, Pain, Decreased range of motion, Impaired UE functional use, Decreased strength, Postural dysfunction  Visit Diagnosis: Pain in right shoulder - Plan: PT plan of care cert/re-cert  Stiffness of right shoulder, not elsewhere classified - Plan: PT plan of care cert/re-cert  Abnormal posture - Plan: PT plan of care cert/re-cert  Muscle weakness (generalized) - Plan: PT plan of care cert/re-cert     Problem List Patient Active Problem List    Diagnosis Date Noted  . Obesity, Class III, BMI 40-49.9 (morbid obesity) (HCC) 06/17/2013  . Rectal bleeding 06/17/2013  . Family history of colon cancer - Maternal Aunt with metastatic colon cancer age 70 06/17/2013  . Right hip pain 09/07/2012  . Left ankle pain 09/07/2012  . Herpes simplex        Clarita Crane, PT, DPT 12/24/15 2:46 PM     [facility] [address]  [address] [place], [place], [address] Phone: [phone]   Fax:  [phone]  Name: SULEIKA DONAVAN MRN: [id number] Date of Birth: 1985-06-24

## 2015-12-24 NOTE — Patient Instructions (Signed)
Flexion (Eccentric) - Active (Cane)    Lift cane with both hands. Avoid hiking shoulders. Lower cane slowly for 3-5 seconds. _10-15__ reps per set, __1-2_ sets per day, __7_ days per week.   http://ecce.exer.us/155   Copyright  VHI. All rights reserved.   Cane Exercise: Abduction    Hold cane with right hand over end, palm-up, with other hand palm-down. Move arm out from side and up by pushing with other arm. Hold __2-3__ seconds. Repeat _10-15___ times. Do __1-2__ sessions per day.  http://gt2.exer.us/82   Copyright  VHI. All rights reserved.   Shoulder Internal Rotation    Standing, feet shoulder width apart, grasp towel with one hand palm forward, arm extended above head, and other hand palm back behind back, arm bent elbow down. Pull gently upward. Hold __20-30__ seconds.  Repeat __2-3__ times. Do __1-2__ sessions per day.  Copyright  VHI. All rights reserved.

## 2016-01-04 ENCOUNTER — Ambulatory Visit: Payer: 59

## 2016-01-04 DIAGNOSIS — M25611 Stiffness of right shoulder, not elsewhere classified: Secondary | ICD-10-CM

## 2016-01-04 DIAGNOSIS — M25511 Pain in right shoulder: Secondary | ICD-10-CM | POA: Diagnosis not present

## 2016-01-04 DIAGNOSIS — R293 Abnormal posture: Secondary | ICD-10-CM

## 2016-01-04 DIAGNOSIS — M6281 Muscle weakness (generalized): Secondary | ICD-10-CM

## 2016-01-04 NOTE — Therapy (Signed)
[facility] [place] [address]  [address] [place], [place], [address] Phone: [phone]   Fax:  [phone]  Physical Therapy Treatment  Patient Details  Name: Misty Mathis MRN: [id number] Date of Birth: 07/03/1985 Referring Provider: Dr. Duwayne HeckJason Rogers  Encounter Date: 01/04/2016      PT End of Session - 01/04/16 1714    Visit Number 2   Number of Visits 12   Date for PT Re-Evaluation 02/04/16   PT Start Time 1708   PT Stop Time 1750   PT Time Calculation (min) 42 min   Activity Tolerance Patient tolerated treatment well   Behavior During Therapy [facility] for tasks assessed/performed      Past Medical History:  Diagnosis Date  . Abnormal Pap smear 07   colposcopy with biopsy  . Anemia   . Herpes simplex   . Obesity     Past Surgical History:  Procedure Laterality Date  . ANKLE SURGERY  02/2009  . BREAST CYST EXCISION  <[age]yr  . PILONIDAL CYST EXCISION    . TUBAL LIGATION  01/14/2011   Procedure: POST PARTUM TUBAL LIGATION;  Surgeon: Caprice Beaverobert M Wein;  Location: [facility];  Service: Gynecology;  Laterality: Bilateral;    There were no vitals filed for this visit.      Subjective Assessment - 01/04/16 1712    Subjective Pt. reports she still can't sleep on the R shoulder at this point and has a 2/10 L shoulder    Patient Stated Goals improve pain and motion   Currently in Pain? Yes   Pain Score 2    Pain Location Shoulder   Pain Orientation Right   Pain Descriptors / Indicators Aching;Throbbing   Pain Type Acute pain   Pain Onset 1 to 4 weeks ago   Pain Frequency Constant   Multiple Pain Sites No      Today's treatment:   Therex: UBE: level 1.0, 3 min each way   Manual:  R shoulder PROM all directions  HEP review:  Standing R shoulder AAROM abduction with wand x 15 reps Standing R shoulder AAROM IR with golf club x 10 reps  Therex: BATCA low row with 20# x 15 reps  Standing R shoulder IR, ER with red TB  x 15 reps Standing scapular retraction/extension with green TB x 15 reps Standing retraction with blue TB x 15 reps          PT Long Term Goals - 01/04/16 1715      PT LONG TERM GOAL #1   Title independent with HEP (02/04/16)   Time 6   Period Weeks   Status On-going     PT LONG TERM GOAL #2   Title improve R shoulder functional internal rotation to at least T8 for improved function and donning of clothes (02/04/16)   Time 6   Period Weeks   Status On-going     PT LONG TERM GOAL #3   Title report pain < 2/10 with activity for improved function (02/04/16)   Time 6   Period Weeks   Status On-going     PT LONG TERM GOAL #4   Title improve R shoulder active abduction to 160 degrees for improved function and mobility (02/04/16)   Time 6   Period Weeks   Status On-going     PT LONG TERM GOAL #5   Title demonstrate 4/5 R shoulder strength without pain for improved function (02/04/16)   Time 6  Period Weeks   Status On-going               Plan - 01/04/16 1714    Clinical Impression Statement Pt. with 2/10 R shoulder pain initially and at worst with therex today.  Pt. reports she feels that she can reach farther behind her back without pain at this point.  R shoulder, red TB resisted ER/IR added today and was tolerated well.  Pt. progressing well at this point reporting less frequent pain and adherence to HEP.     PT Treatment/Interventions ADLs/Self Care Home Management;Cryotherapy;Electrical Stimulation;Therapeutic exercise;Iontophoresis 4mg /ml Dexamethasone;Neuromuscular re-education;Moist Heat;Ultrasound;Therapeutic activities;Patient/family education;Manual techniques;Passive range of motion;Vasopneumatic Device;Taping;Dry needling   PT Next Visit Plan Progress ROM and begin theraband strengthening      Patient will benefit from skilled therapeutic intervention in order to improve the following deficits and impairments:  Decreased scar mobility, Pain, Decreased  range of motion, Impaired UE functional use, Decreased strength, Postural dysfunction  Visit Diagnosis: Pain in right shoulder  Stiffness of right shoulder, not elsewhere classified  Abnormal posture  Muscle weakness (generalized)     Problem List Patient Active Problem List   Diagnosis Date Noted  . Obesity, Class III, BMI 40-49.9 (morbid obesity) (HCC) 06/17/2013  . Rectal bleeding 06/17/2013  . Family history of colon cancer - Maternal Aunt with metastatic colon cancer age 44 06/17/2013  . Right hip pain 09/07/2012  . Left ankle pain 09/07/2012  . Herpes simplex     Kermit Balo, PTA 01/04/2016, 6:08 PM  [facility] [address]  [address] [place], [place], [address] Phone: [phone]   Fax:  [phone]  Name: Misty Mathis MRN: [id number] Date of Birth: 1986/01/07

## 2016-01-07 ENCOUNTER — Ambulatory Visit: Payer: 59

## 2016-01-07 DIAGNOSIS — R293 Abnormal posture: Secondary | ICD-10-CM

## 2016-01-07 DIAGNOSIS — M25611 Stiffness of right shoulder, not elsewhere classified: Secondary | ICD-10-CM

## 2016-01-07 DIAGNOSIS — M6281 Muscle weakness (generalized): Secondary | ICD-10-CM

## 2016-01-07 DIAGNOSIS — M25511 Pain in right shoulder: Secondary | ICD-10-CM | POA: Diagnosis not present

## 2016-01-07 NOTE — Therapy (Signed)
[facility] [place] [address]  [address] [place], [place], [address] Phone: [phone]   Fax:  [phone]  Physical Therapy Treatment  Patient Details  Name: Misty Mathis MRN: [id number] Date of Birth: 11/24/1985 Referring Provider: Dr. Duwayne HeckJason Rogers  Encounter Date: 01/07/2016      PT End of Session - 01/07/16 1724    Visit Number 3   Number of Visits 12   Date for PT Re-Evaluation 02/04/16   PT Start Time 1703   PT Stop Time 1745   PT Time Calculation (min) 42 min   Activity Tolerance Patient tolerated treatment well   Behavior During Therapy [facility] for tasks assessed/performed      Past Medical History:  Diagnosis Date  . Abnormal Pap smear 07   colposcopy with biopsy  . Anemia   . Herpes simplex   . Obesity     Past Surgical History:  Procedure Laterality Date  . ANKLE SURGERY  02/2009  . BREAST CYST EXCISION  <689yr  . PILONIDAL CYST EXCISION    . TUBAL LIGATION  01/14/2011   Procedure: POST PARTUM TUBAL LIGATION;  Surgeon: Caprice Beaverobert M Wein;  Location: [facility];  Service: Gynecology;  Laterality: Bilateral;    There were no vitals filed for this visit.      Subjective Assessment - 01/07/16 1705    Subjective Pt. reporting she still can't sleep on R side due to pain    Patient Stated Goals improve pain and motion   Currently in Pain? Yes   Pain Score 1    Pain Location Shoulder   Pain Orientation Right   Pain Descriptors / Indicators Aching;Throbbing   Pain Type Acute pain   Multiple Pain Sites No      Today's treatment:  Therex: UBE: level 1.5, 3 min each way  Manual:  L shoulder stretch in all motions  Therex: Standing R shoulder IR/ER with green TB x 15 reps  Standing R shoulder D1 flexion/extenison with green TB x 15 reps Standing R shoulder D2 flexion/extension with green TB x 15 reps BATCA low row 35# x 15 reps BATCA pull down 20# x 15 reps Body blade R shoulder IR/ER x 1 min; pt. with  difficulty finding rhythm  Body blade B shoulder flexion x 1 min         PT Education - 01/07/16 1747    Education provided Yes   Education Details shoulder IR/ER with green TB issued to pt.     Person(s) Educated Patient   Methods Explanation;Handout   Comprehension Verbalized understanding;Returned demonstration;Need further instruction             PT Long Term Goals - 01/04/16 1715      PT LONG TERM GOAL #1   Title independent with HEP (02/04/16)   Time 6   Period Weeks   Status On-going     PT LONG TERM GOAL #2   Title improve R shoulder functional internal rotation to at least T8 for improved function and donning of clothes (02/04/16)   Time 6   Period Weeks   Status On-going     PT LONG TERM GOAL #3   Title report pain < 2/10 with activity for improved function (02/04/16)   Time 6   Period Weeks   Status On-going     PT LONG TERM GOAL #4   Title improve R shoulder active abduction to 160 degrees for improved function and mobility (02/04/16)  Time 6   Period Weeks   Status On-going     PT LONG TERM GOAL #5   Title demonstrate 4/5 R shoulder strength without pain for improved function (02/04/16)   Time 6   Period Weeks   Status On-going               Plan - 01/07/16 1724    Clinical Impression Statement Pt. with less pain today and tolerated focus on functional TB resisted shoulder strengthening well.  Pt. with infrequent R shoulder pain today with therex however shoulder strengthening activity performed bellow shoulder height today.  Shoulder IR/ER with green TB added to HEP today with pt. demonstrating/verbalizing understanding of activity.  Pt. reporting she is still unable to sleep on R side in bed at this time.     PT Treatment/Interventions ADLs/Self Care Home Management;Cryotherapy;Electrical Stimulation;Therapeutic exercise;Iontophoresis 4mg /ml Dexamethasone;Neuromuscular re-education;Moist Heat;Ultrasound;Therapeutic  activities;Patient/family education;Manual techniques;Passive range of motion;Vasopneumatic Device;Taping;Dry needling   PT Next Visit Plan Progress ROM and continue theraband strengthening      Patient will benefit from skilled therapeutic intervention in order to improve the following deficits and impairments:  Decreased scar mobility, Pain, Decreased range of motion, Impaired UE functional use, Decreased strength, Postural dysfunction  Visit Diagnosis: Pain in right shoulder  Stiffness of right shoulder, not elsewhere classified  Abnormal posture  Muscle weakness (generalized)     Problem List Patient Active Problem List   Diagnosis Date Noted  . Obesity, Class III, BMI 40-49.9 (morbid obesity) (HCC) 06/17/2013  . Rectal bleeding 06/17/2013  . Family history of colon cancer - Maternal Aunt with metastatic colon cancer age 73 06/17/2013  . Right hip pain 09/07/2012  . Left ankle pain 09/07/2012  . Herpes simplex     Misty Mathis, PTA 01/07/2016, 5:52 PM  [facility] [address]  [address] [place], [place], [address] Phone: [phone]   Fax:  [phone]  Name: Misty Mathis MRN: [id number] Date of Birth: [birth year]

## 2016-01-11 ENCOUNTER — Ambulatory Visit: Payer: 59 | Attending: Orthopedic Surgery

## 2016-01-11 DIAGNOSIS — M25511 Pain in right shoulder: Secondary | ICD-10-CM | POA: Diagnosis present

## 2016-01-11 DIAGNOSIS — M25611 Stiffness of right shoulder, not elsewhere classified: Secondary | ICD-10-CM | POA: Diagnosis present

## 2016-01-11 DIAGNOSIS — R293 Abnormal posture: Secondary | ICD-10-CM | POA: Diagnosis present

## 2016-01-11 DIAGNOSIS — M6281 Muscle weakness (generalized): Secondary | ICD-10-CM | POA: Diagnosis present

## 2016-01-11 NOTE — Therapy (Signed)
[facility] [address]  [address] [place], [place], [address] Phone: [phone]   Fax:  [phone]  Physical Therapy Treatment  Patient Details  Name: Misty Mathis MRN: [id number] Date of Birth: 11-26-1985 Referring Provider: Dr. Duwayne Heck  Encounter Date: 01/11/2016      PT End of Session - 01/11/16 1705    Visit Number 4   Number of Visits 12   Date for PT Re-Evaluation 02/04/16   PT Start Time 1700   PT Stop Time 1752   PT Time Calculation (min) 52 min   Activity Tolerance Patient tolerated treatment well   Behavior During Therapy [facility] for tasks assessed/performed      Past Medical History:  Diagnosis Date  . Abnormal Pap smear 07   colposcopy with biopsy  . Anemia   . Herpes simplex   . Obesity     Past Surgical History:  Procedure Laterality Date  . ANKLE SURGERY  02/2009  . BREAST CYST EXCISION  <14yr  . PILONIDAL CYST EXCISION    . TUBAL LIGATION  01/14/2011   Procedure: POST PARTUM TUBAL LIGATION;  Surgeon: Caprice Beaver;  Location: [facility];  Service: Gynecology;  Laterality: Bilateral;    There were no vitals filed for this visit.      Subjective Assessment - 01/11/16 1703    Subjective Pt. R shoulder pain is worse today with more of a throbbing pain.     Patient Stated Goals improve pain and motion   Currently in Pain? Yes   Pain Score 5    Pain Location Shoulder   Pain Orientation Right   Pain Descriptors / Indicators Aching;Throbbing   Pain Type Acute pain   Pain Onset 1 to 4 weeks ago   Pain Frequency Constant   Multiple Pain Sites No         Today's treatment:  Therex (5/10 throbbing pain in R shoulder today): UBE: level 2.0, 3 min each way  Manual: R shoulder gentle grade 2 oscillations; pt. Pain remained unchanged with this  Therex: Supine B shoulder ER with green TB x 15 reps  Supine R shoulder IR with green TB x 15 reps BATCA pulldown 25# x 15 reps  BATCA low  row x 25# x 15 reps  Modalities: IFC E-stim to R shoulder: Hooklying channel 1, 2, 80-150 Hz, intensity to tolerance, 12'          PT Long Term Goals - 01/04/16 1715      PT LONG TERM GOAL #1   Title independent with HEP (02/04/16)   Time 6   Period Weeks   Status On-going     PT LONG TERM GOAL #2   Title improve R shoulder functional internal rotation to at least T8 for improved function and donning of clothes (02/04/16)   Time 6   Period Weeks   Status On-going     PT LONG TERM GOAL #3   Title report pain < 2/10 with activity for improved function (02/04/16)   Time 6   Period Weeks   Status On-going     PT LONG TERM GOAL #4   Title improve R shoulder active abduction to 160 degrees for improved function and mobility (02/04/16)   Time 6   Period Weeks   Status On-going     PT LONG TERM GOAL #5   Title demonstrate 4/5 R shoulder strength without pain for improved function (02/04/16)   Time 6  Period Weeks   Status On-going               Plan - 01/11/16 1706    Clinical Impression Statement Pt. reporting worse R shoulder throbbing pain today which she says was triggered following lying on R side in bed.  Today's treatment focused on R shoulder strengthening and scapular strengthening activity per pt. tolerance.  Pt. with limited tolerance for strengthening activity today secondary to R shoulder pain.  E-stim/moist heat combo applied to R shoulder following treatment with pt. leaving therapy pain free.     PT Treatment/Interventions ADLs/Self Care Home Management;Cryotherapy;Electrical Stimulation;Therapeutic exercise;Iontophoresis 4mg /ml Dexamethasone;Neuromuscular re-education;Moist Heat;Ultrasound;Therapeutic activities;Patient/family education;Manual techniques;Passive range of motion;Vasopneumatic Device;Taping;Dry needling   PT Next Visit Plan Progress ROM and continue theraband strengthening      Patient will benefit from skilled therapeutic  intervention in order to improve the following deficits and impairments:  Decreased scar mobility, Pain, Decreased range of motion, Impaired UE functional use, Decreased strength, Postural dysfunction  Visit Diagnosis: Right shoulder pain, unspecified chronicity  Stiffness of right shoulder, not elsewhere classified  Abnormal posture  Muscle weakness (generalized)     Problem List Patient Active Problem List   Diagnosis Date Noted  . Obesity, Class III, BMI 40-49.9 (morbid obesity) (HCC) 06/17/2013  . Rectal bleeding 06/17/2013  . Family history of colon cancer - Maternal Aunt with metastatic colon cancer age 32 06/17/2013  . Right hip pain 09/07/2012  . Left ankle pain 09/07/2012  . Herpes simplex     Kermit BaloMicah Elleah Hemsley, PTA 01/11/2016, 6:05 PM  [facility] [address]  [address] [place], [place], [address] Phone: [phone]   Fax:  [phone]  Name: Annice NeedyJessica M Lussier MRN: [id number] Date of Birth: 05/09/1985

## 2016-01-14 ENCOUNTER — Ambulatory Visit: Payer: 59

## 2016-01-14 DIAGNOSIS — M25511 Pain in right shoulder: Secondary | ICD-10-CM | POA: Diagnosis not present

## 2016-01-14 DIAGNOSIS — R293 Abnormal posture: Secondary | ICD-10-CM

## 2016-01-14 DIAGNOSIS — M6281 Muscle weakness (generalized): Secondary | ICD-10-CM

## 2016-01-14 DIAGNOSIS — M25611 Stiffness of right shoulder, not elsewhere classified: Secondary | ICD-10-CM

## 2016-01-14 NOTE — Therapy (Signed)
[facility] [address]  [address] [place], [place], [address] Phone: [phone]   Fax:  [phone]  Physical Therapy Treatment  Patient Details  Name: Misty NeedyJessica M Belk MRN: [id number] Date of Birth: 03/28/1986 Referring Provider: Dr. Duwayne HeckJason Rogers  Encounter Date: 01/14/2016      PT End of Session - 01/14/16 1709    Visit Number 5   Number of Visits 12   Date for PT Re-Evaluation 02/04/16   PT Start Time 1707   PT Stop Time 1747   PT Time Calculation (min) 40 min   Activity Tolerance Patient tolerated treatment well   Behavior During Therapy [facility] for tasks assessed/performed      Past Medical History:  Diagnosis Date  . Abnormal Pap smear 07   colposcopy with biopsy  . Anemia   . Herpes simplex   . Obesity     Past Surgical History:  Procedure Laterality Date  . ANKLE SURGERY  02/2009  . BREAST CYST EXCISION  <3062yr  . PILONIDAL CYST EXCISION    . TUBAL LIGATION  01/14/2011   Procedure: POST PARTUM TUBAL LIGATION;  Surgeon: Caprice Beaverobert M Wein;  Location: [facility];  Service: Gynecology;  Laterality: Bilateral;    There were no vitals filed for this visit.      Subjective Assessment - 01/14/16 1708    Subjective Pt. reporting she still isn't able to sleep on R side at this point however that she is feeling much better today and is pain free.     Patient Stated Goals improve pain and motion   Currently in Pain? No/denies   Multiple Pain Sites No       Today's treatment:  Therex: UBE: level 2.5, 3 min each way Standing R shoulder ER/IR with green TB x 15 reps  BATCA pulldown 35# x 15 reps  BATCA low row x 35# x 15 reps Body blade IR/ER 45"  Body blade B shoulder flexion 45"  Leaning on 1/2 foam bolster on wall:        B shoulder flexion with 1# x 10 reps        B shoulder scaption with 1# x 10 reps; 3/10 pain with this however quickly resolved TRX low row x 10 reps Supine R shoulder circles 5# CW, CCW x  15 reps Supine R shoulder CW, CCW x 10 reps B shoulder protraction with 5# x 30 reps L sidelying R shoulder abduction 0-90 dg 2# x 15 reps R shoulder abduction, flexion stretch on peg board x 30 sec on each           PT Education - 01/14/16 1756    Education provided Yes   Education Details scap. retraction, scap. retraction/ext with green TB issued to pt.    Person(s) Educated Patient   Methods Explanation;Demonstration;Handout   Comprehension Verbalized understanding;Returned demonstration             PT Long Term Goals - 01/14/16 1722      PT LONG TERM GOAL #1   Title independent with HEP (02/04/16)   Time 6   Period Weeks   Status On-going     PT LONG TERM GOAL #2   Title improve R shoulder functional internal rotation to at least T8 for improved function and donning of clothes (02/04/16)   Time 6   Period Weeks   Status Achieved     PT LONG TERM GOAL #3   Title report pain < 2/10  with activity for improved function (02/04/16)   Time 6   Period Weeks   Status On-going     PT LONG TERM GOAL #4   Title improve R shoulder active abduction to 160 degrees for improved function and mobility (02/04/16)   Time 6   Period Weeks   Status On-going     PT LONG TERM GOAL #5   Title demonstrate 4/5 R shoulder strength without pain for improved function (02/04/16)   Time 6   Period Weeks   Status On-going               Plan - 01/14/16 1710    Clinical Impression Statement Pt. pain free today and able to tolerate progression in scapular and R shoulder strengthening activity well.  Pt. pain free throughout therex and progressing at this point.  Pt. able to achieve R shoulder functional IR goal today and progressing toward other goals well.     PT Treatment/Interventions ADLs/Self Care Home Management;Cryotherapy;Electrical Stimulation;Therapeutic exercise;Iontophoresis 4mg /ml Dexamethasone;Neuromuscular re-education;Moist Heat;Ultrasound;Therapeutic  activities;Patient/family education;Manual techniques;Passive range of motion;Vasopneumatic Device;Taping;Dry needling   PT Next Visit Plan Progress ROM and continue theraband strengthening      Patient will benefit from skilled therapeutic intervention in order to improve the following deficits and impairments:  Decreased scar mobility, Pain, Decreased range of motion, Impaired UE functional use, Decreased strength, Postural dysfunction  Visit Diagnosis: Right shoulder pain, unspecified chronicity  Stiffness of right shoulder, not elsewhere classified  Abnormal posture  Muscle weakness (generalized)     Problem List Patient Active Problem List   Diagnosis Date Noted  . Obesity, Class III, BMI 40-49.9 (morbid obesity) (HCC) 06/17/2013  . Rectal bleeding 06/17/2013  . Family history of colon cancer - Maternal Aunt with metastatic colon cancer age 43 06/17/2013  . Right hip pain 09/07/2012  . Left ankle pain 09/07/2012  . Herpes simplex     Kermit Balo, PTA 01/14/2016, 5:59 PM  [facility] [address]  [address] [place], [place], [address] Phone: [phone]   Fax:  [phone]  Name: NORETA KUE MRN: [id number] Date of Birth: Nov 01, 1985

## 2016-01-18 ENCOUNTER — Ambulatory Visit: Payer: 59 | Admitting: Physical Therapy

## 2016-01-18 DIAGNOSIS — R293 Abnormal posture: Secondary | ICD-10-CM

## 2016-01-18 DIAGNOSIS — M25511 Pain in right shoulder: Secondary | ICD-10-CM | POA: Diagnosis not present

## 2016-01-18 DIAGNOSIS — M25611 Stiffness of right shoulder, not elsewhere classified: Secondary | ICD-10-CM

## 2016-01-18 DIAGNOSIS — M6281 Muscle weakness (generalized): Secondary | ICD-10-CM

## 2016-01-18 NOTE — Therapy (Signed)
[facility] [address]  [address] [place], [place], [address] Phone: [phone]   Fax:  [phone]  Physical Therapy Treatment  Patient Details  Name: Misty Mathis MRN: [id number] Date of Birth: Jan 15, 1986 Referring Provider: Dr. Duwayne Heck  Encounter Date: 01/18/2016      PT End of Session - 01/18/16 0749    Visit Number 6   Number of Visits 12   Date for PT Re-Evaluation 02/04/16   PT Start Time 0705   PT Stop Time 0745   PT Time Calculation (min) 40 min   Activity Tolerance Patient tolerated treatment well   Behavior During Therapy [facility] for tasks assessed/performed      Past Medical History:  Diagnosis Date  . Abnormal Pap smear 07   colposcopy with biopsy  . Anemia   . Herpes simplex   . Obesity     Past Surgical History:  Procedure Laterality Date  . ANKLE SURGERY  02/2009  . BREAST CYST EXCISION  <47yr  . PILONIDAL CYST EXCISION    . TUBAL LIGATION  01/14/2011   Procedure: POST PARTUM TUBAL LIGATION;  Surgeon: Caprice Beaver;  Location: [facility];  Service: Gynecology;  Laterality: Bilateral;    There were no vitals filed for this visit.      Subjective Assessment - 01/18/16 0706    Subjective no pain so far today; hasn't tried sleeping on R side lately.   Limitations Lifting;House hold activities   Diagnostic tests x rays: bursitis   Patient Stated Goals improve pain and motion   Currently in Pain? No/denies            [facility] PT Assessment - 01/18/16 0739      AROM   Overall AROM Comments tested in sitting   Right Shoulder Flexion 125 Degrees   Right Shoulder ABduction 110 Degrees     PROM   Right Shoulder Flexion 150 Degrees   Right Shoulder ABduction 155 Degrees                     [facility] Adult PT Treatment/Exercise - 01/18/16 0707      Shoulder Exercises: Supine   Protraction Right;15 reps;Weights   Protraction Weight (lbs) 5   Protraction Limitations on 1/2  foam roll   Horizontal ABduction Both;15 reps;Theraband   Theraband Level (Shoulder Horizontal ABduction) Level 3 (Green)   Horizontal ABduction Limitations on 1/2 foam roll   External Rotation Both;15 reps;Theraband   Theraband Level (Shoulder External Rotation) Level 3 (Green)   External Rotation Limitations on 1/2 foam roll   Flexion Right;15 reps;Weights   Shoulder Flexion Weight (lbs) 2   Flexion Limitations on 1/2 foam roll     Shoulder Exercises: Sidelying   External Rotation Right;15 reps;Weights   External Rotation Weight (lbs) 3   ABduction Right;15 reps;Weights   ABduction Weight (lbs) 3     Shoulder Exercises: Standing   Extension Both;15 reps;Theraband   Theraband Level (Shoulder Extension) Level 4 (Blue)   Retraction Both;15 reps;Theraband   Theraband Level (Shoulder Retraction) Level 4 (Blue)   Retraction Limitations 5 sec hold   Other Standing Exercises D1/D2 flex/ext blue theraband x 15 each RUE only     Shoulder Exercises: ROM/Strengthening   UBE (Upper Arm Bike) L 3.0 x 6 min (3' fwd/3' bwd)   Cybex Row Limitations 35# x15 each grip   Rhythmic Stabilization, Supine 5# 2x20 sec     Shoulder Exercises: Stretch  Other Shoulder Stretches supine on 1/2 foam roll x 2 min arms out   Other Shoulder Stretches doorway stretch low and mid level x 30 sec each (attempted high level but increased pain at this time)                     PT Long Term Goals - 01/14/16 1722      PT LONG TERM GOAL #1   Title independent with HEP (02/04/16)   Time 6   Period Weeks   Status On-going     PT LONG TERM GOAL #2   Title improve R shoulder functional internal rotation to at least T8 for improved function and donning of clothes (02/04/16)   Time 6   Period Weeks   Status Achieved     PT LONG TERM GOAL #3   Title report pain < 2/10 with activity for improved function (02/04/16)   Time 6   Period Weeks   Status On-going     PT LONG TERM GOAL #4   Title  improve R shoulder active abduction to 160 degrees for improved function and mobility (02/04/16)   Time 6   Period Weeks   Status On-going     PT LONG TERM GOAL #5   Title demonstrate 4/5 R shoulder strength without pain for improved function (02/04/16)   Time 6   Period Weeks   Status On-going               Plan - 01/18/16 0750    Clinical Impression Statement In sitting, R shoulder flexion and abduction unchanged (decreased with abduction) at this time, but pt with decreased pain and feels she is getting stronger.  Will continue to benefit from PT to maximize function.  Discussed home TENS unit and pt to consider.   PT Treatment/Interventions ADLs/Self Care Home Management;Cryotherapy;Electrical Stimulation;Therapeutic exercise;Iontophoresis 4mg /ml Dexamethasone;Neuromuscular re-education;Moist Heat;Ultrasound;Therapeutic activities;Patient/family education;Manual techniques;Passive range of motion;Vasopneumatic Device;Taping;Dry needling   PT Next Visit Plan Progress ROM and continue theraband strengthening; give information on TENS if pt requests   Consulted and Agree with Plan of Care Patient      Patient will benefit from skilled therapeutic intervention in order to improve the following deficits and impairments:  Decreased scar mobility, Pain, Decreased range of motion, Impaired UE functional use, Decreased strength, Postural dysfunction  Visit Diagnosis: Right shoulder pain, unspecified chronicity  Stiffness of right shoulder, not elsewhere classified  Abnormal posture  Muscle weakness (generalized)     Problem List Patient Active Problem List   Diagnosis Date Noted  . Obesity, Class III, BMI 40-49.9 (morbid obesity) (HCC) 06/17/2013  . Rectal bleeding 06/17/2013  . Family history of colon cancer - Maternal Aunt with metastatic colon cancer age 32 06/17/2013  . Right hip pain 09/07/2012  . Left ankle pain 09/07/2012  . Herpes simplex        Clarita CraneStephanie F  Catalino Plascencia, PT, DPT 01/18/16 7:52 AM    [facility] [address]  [address] [place], [place], [address] Phone: [phone]   Fax:  [phone]  Name: Misty Mathis MRN: [id number] Date of Birth: 09/29/1985

## 2016-01-18 NOTE — Patient Instructions (Signed)
CHEST: Doorway, Bilateral - Standing    Standing in doorway, place hands on wall with elbows bent at shoulder height. Lean forward. Hold _20__ seconds. _2-3__ reps per set, _1-2__ sets per day, _7__ days per week.  Also perform with arms down.  Copyright  VHI. All rights reserved.

## 2016-01-21 ENCOUNTER — Ambulatory Visit: Payer: 59

## 2016-01-25 ENCOUNTER — Ambulatory Visit: Payer: 59 | Admitting: Physical Therapy

## 2016-01-25 DIAGNOSIS — R293 Abnormal posture: Secondary | ICD-10-CM

## 2016-01-25 DIAGNOSIS — M25611 Stiffness of right shoulder, not elsewhere classified: Secondary | ICD-10-CM

## 2016-01-25 DIAGNOSIS — M25511 Pain in right shoulder: Secondary | ICD-10-CM | POA: Diagnosis not present

## 2016-01-25 DIAGNOSIS — M6281 Muscle weakness (generalized): Secondary | ICD-10-CM

## 2016-01-25 NOTE — Therapy (Addendum)
[facility] [address]  [place], [place], [address] Phone: [phone]   Fax:  [phone]  Physical Therapy Treatment  Patient Details  Name: Misty Mathis MRN: [id number] Date of Birth: May 08, 1985 Referring Provider: Dr. Victorino December  Encounter Date: 01/25/2016      PT End of Session - 01/25/16 0743    Visit Number 7   Number of Visits 12   Date for PT Re-Evaluation 02/04/16   PT Start Time 0703   PT Stop Time 0742   PT Time Calculation (min) 39 min   Activity Tolerance Patient tolerated treatment well   Behavior During Therapy [facility] for tasks assessed/performed      Past Medical History:  Diagnosis Date  . Abnormal Pap smear 07   colposcopy with biopsy  . Anemia   . Herpes simplex   . Obesity     Past Surgical History:  Procedure Laterality Date  . ANKLE SURGERY  02/2009  . BREAST CYST EXCISION  <67yr . PILONIDAL CYST EXCISION    . TUBAL LIGATION  01/14/2011   Procedure: POST PARTUM TUBAL LIGATION;  Surgeon: RPaulo Fruit  [facility];  Service: Gynecology;  Laterality: Bilateral;    There were no vitals filed for this visit.      Subjective Assessment - 01/25/16 0705    Subjective shoulder is "ok." slept on shoulder this weekend, woke up a little sore but not as sore as the past.  overall pain 1-2/10.  getting dressed is "getting better."  wants to hopefully hold on Thursday..   Limitations Lifting;House hold activities   Diagnostic tests x rays: bursitis   Patient Stated Goals improve pain and motion   Currently in Pain? Yes   Pain Score 1    Pain Location Shoulder   Pain Orientation Right   Pain Descriptors / Indicators Aching   Pain Type Acute pain   Pain Onset 1 to 4 weeks ago   Pain Frequency Constant   Aggravating Factors  overuse, overhead lifting, reaching behind back            [facility] - 01/25/16 0745      AROM   Right Shoulder Internal Rotation --   FIR to T6, no pain                     [facility] Adult PT Treatment/Exercise - 01/25/16 0709      Shoulder Exercises: Supine   Protraction Right;15 reps;Weights   Protraction Weight (lbs) 5   Flexion Right;15 reps;Weights   Shoulder Flexion Weight (lbs) 3     Shoulder Exercises: Prone   Retraction Right;15 reps;Weights   Retraction Weight (lbs) 3   Flexion Right;15 reps   Flexion Weight (lbs) 2   Extension Right;15 reps;Weights   Extension Weight (lbs) 3   Horizontal ABduction 1 Right;15 reps;Weights   Horizontal ABduction 1 Weight (lbs) 2     Shoulder Exercises: Sidelying   External Rotation Right;15 reps;Weights   External Rotation Weight (lbs) 3   ABduction Right;15 reps;Weights   ABduction Weight (lbs) 3     Shoulder Exercises: Standing   External Rotation Right;15 reps;Theraband   Theraband Level (Shoulder External Rotation) Level 4 (Blue)   Internal Rotation Right;15 reps;Theraband   Theraband Level (Shoulder Internal Rotation) Level 4 (Blue)   Extension Both;15 reps;Theraband   Theraband Level (Shoulder Extension) Level 4 (Blue)   Retraction Both;15 reps;Theraband   Theraband Level (  Shoulder Retraction) Level 4 (Blue)   Retraction Limitations 5 sec hold     Shoulder Exercises: ROM/Strengthening   UBE (Upper Arm Bike) L 3.0 x 6 min (3' fwd/3' bwd)     Shoulder Exercises: Stretch   Other Shoulder Stretches doorway stretch low and mid level x 30 sec each (attempted high level but increased pain at this time)                     PT Long Term Goals - 01/25/16 0743      PT LONG TERM GOAL #1   Title independent with HEP (02/04/16)   Status Achieved     PT LONG TERM GOAL #2   Title improve R shoulder functional internal rotation to at least T8 for improved function and donning of clothes (02/04/16)   Status Achieved     PT LONG TERM GOAL #3   Title report pain < 2/10 with activity for improved function (02/04/16)   Status Achieved      PT LONG TERM GOAL #4   Title improve R shoulder active abduction to 160 degrees for improved function and mobility (02/04/16)   Status On-going     PT LONG TERM GOAL #5   Title demonstrate 4/5 R shoulder strength without pain for improved function (02/04/16)   Status On-going               Plan - 01/25/16 0743    Clinical Impression Statement Pt has met first 3 LTGs and continues to tolerate sessions well wihtout increase in pain.  Reports overall pain only 1-2/10 and is doing well.  Anticipate holding PT after next session.   PT Treatment/Interventions ADLs/Self Care Home Management;Cryotherapy;Electrical Stimulation;Therapeutic exercise;Iontophoresis '4mg'$ /ml Dexamethasone;Neuromuscular re-education;Moist Heat;Ultrasound;Therapeutic activities;Patient/family education;Manual techniques;Passive range of motion;Vasopneumatic Device;Taping;Dry needling   PT Next Visit Plan check remaining goals, hold x 30 days   Consulted and Agree with Plan of Care Patient      Patient will benefit from skilled therapeutic intervention in order to improve the following deficits and impairments:  Decreased scar mobility, Pain, Decreased range of motion, Impaired UE functional use, Decreased strength, Postural dysfunction  Visit Diagnosis: Right shoulder pain, unspecified chronicity  Stiffness of right shoulder, not elsewhere classified  Abnormal posture  Muscle weakness (generalized)     Problem List Patient Active Problem List   Diagnosis Date Noted  . Obesity, Class III, BMI 40-49.9 (morbid obesity) ([place]) 06/17/2013  . Rectal bleeding 06/17/2013  . Family history of colon cancer - Maternal Aunt with metastatic colon cancer age 37 06/17/2013  . Right hip pain 09/07/2012  . Left ankle pain 09/07/2012  . Herpes simplex        Laureen Abrahams, PT, DPT 01/25/16 7:46 AM    [facility] [address]  [place], [place], [address] Phone: [phone]   Fax:  [phone]  Name: Misty Mathis MRN: [id number] Date of Birth: 1985/11/28       PHYSICAL THERAPY DISCHARGE SUMMARY  Visits from Start of Care: 7  Current functional level related to goals / functional outcomes: See above; pt did not return to assess remaining goals but anticipate goals would have been met or nearly met given progress to date.   Remaining deficits: Remaining deficits truly unknown as pt didn't return, but feel given progress made deficits would be minimal   Education / Equipment: HEP, posture  Plan: Patient agrees to discharge.  Patient goals  were partially met. Patient is being discharged due to not returning since the last visit.  ?????     Laureen Abrahams, PT, DPT 02/29/16 7:34 AM  [facility] [place], [place] [address]  [phone] (office) [phone] (fax)

## 2016-01-25 NOTE — Patient Instructions (Signed)
Extension - Prone (Dumbbell)    Lie with right arm hanging off side of bed. Lift hand back and up. Repeat __15__ times per set. Do __1__ sets per session. Do _5-7___ sessions per week. Use __3__ lb weight.   Copyright  VHI. All rights reserved.     Prone Dumbbell Row    Lie prone on bed, body straight, toes touching floor. Lift _3__ lb dumbbells, pulling shoulder blades together. Do _1__ sets of _15__ repetitions.  Copyright  VHI. All rights reserved.    Abduction: Horizontal - Prone (Dumbbell)    Lie with right arm hanging down. Lift arm out to side, thumb up. Repeat __15__ times per set. Do __1__ sets per session. Do __5-7__ sessions per week. Use _2___ lb weight.   Copyright  VHI. All rights reserved.    Scapular Depression: "Y" (Eccentric) - Prone (Ball)    Lie on bed. Quickly lift right arm forward into "Y", thumbs up. Squeeze shoulder blades. Keep head in line with spine. _15__ reps per set, _1__ sets per day, _5-7__ days per week. Use 2 lbs.   http://ecce.exer.us/223   Copyright  VHI. All rights reserved.

## 2016-01-28 ENCOUNTER — Ambulatory Visit: Payer: 59

## 2016-08-12 ENCOUNTER — Encounter (HOSPITAL_BASED_OUTPATIENT_CLINIC_OR_DEPARTMENT_OTHER): Payer: Self-pay | Admitting: *Deleted

## 2016-08-12 ENCOUNTER — Emergency Department (HOSPITAL_BASED_OUTPATIENT_CLINIC_OR_DEPARTMENT_OTHER)
Admission: EM | Admit: 2016-08-12 | Discharge: 2016-08-12 | Disposition: A | Discharge status: Home / Self Care | Payer: 59 | Attending: Emergency Medicine | Admitting: Emergency Medicine

## 2016-08-12 DIAGNOSIS — K649 Unspecified hemorrhoids: Secondary | ICD-10-CM

## 2016-08-12 DIAGNOSIS — K625 Hemorrhage of anus and rectum: Secondary | ICD-10-CM

## 2016-08-12 MED ORDER — HYDROCORTISONE 2.5 % RE CREA: TOPICAL_CREAM | RECTAL | 0 refills | Status: DC

## 2016-08-12 MED FILL — PROCTO-MED HC 2.5% CREAM: 2.5 | 15 days supply | Qty: 30 | Fill #0

## 2016-08-12 NOTE — ED Notes (Signed)
patient was seen, assessed and treated by the PA. THis RN notes that the patient is WNL at D/c

## 2016-08-12 NOTE — ED Triage Notes (Signed)
BM this am and bright red rectal bleeding since. Hx of hemorrhoids.

## 2016-08-12 NOTE — Discharge Instructions (Signed)
Do warm soaks several times a day Use hemorrhoid cream twice daily Follow up with general surgery Return for severe bleeding or pain, abdominal pain, fever, lightheadedness or passing out

## 2016-08-12 NOTE — ED Provider Notes (Signed)
[facility] Provider Note   CSN: [id number] Arrival date & time: 08/12/16  1309     History   Chief Complaint Chief Complaint  Patient presents with  . Rectal Bleeding    HPI Misty Mathis is a 31 y.o. female who presents with rectal bleeding. PMH significant for hx of rectal bleeding due to hemorrhoids, obesity. She has blood in the stool daily however today it was a lot worse with bright red blood in the toilet bowl and clots as well as "throbbing" in the rectal area. She has seen a general surgeon for this however they advised conservative management since it is relatively small. She also had a colonoscopy to r/o cancer since her maternal aunt was diagnosed with metastatic colon cancer in her 30s which was unremarkable other than known hemorrhoids. She denies fever, abdominal pain, N/V/D, constipation, straining. She has not tried anything at home because the blood in her stool is usually minimal and she does not have pain.  HPI  Past Medical History:  Diagnosis Date  . Abnormal Pap smear 07   colposcopy with biopsy  . Anemia   . Herpes simplex   . Obesity     Patient Active Problem List   Diagnosis Date Noted  . Obesity, Class III, BMI 40-49.9 (morbid obesity) (HCC) 06/17/2013  . Rectal bleeding 06/17/2013  . Family history of colon cancer - Maternal Aunt with metastatic colon cancer age 33 06/17/2013  . Right hip pain 09/07/2012  . Left ankle pain 09/07/2012  . Herpes simplex     Past Surgical History:  Procedure Laterality Date  . ANKLE SURGERY  02/2009  . BREAST CYST EXCISION  <[age]yr  . PILONIDAL CYST EXCISION    . TUBAL LIGATION  01/14/2011   Procedure: POST PARTUM TUBAL LIGATION;  Surgeon: Caprice Beaverobert M Wein;  Location: [facility];  Service: Gynecology;  Laterality: Bilateral;    OB History    Gravida Para Term Preterm AB Living   3 3 3     3    SAB TAB Ectopic Multiple Live Births           1       Home Medications    Prior to Admission  medications   Medication Sig Start Date End Date Taking? Authorizing Provider  Diethylpropion HCl CR 75 MG TB24 Take by mouth.    Historical Provider, MD  famotidine (PEPCID) 20 MG tablet Take 1 tablet (20 mg total) by mouth 2 (two) times daily. Patient not taking: Reported on 12/24/2015 06/27/13   Elpidio AnisShari Upstill, PA-C  predniSONE (DELTASONE) 20 MG tablet Take 3 tablets (60 mg total) by mouth daily. Patient not taking: Reported on 12/24/2015 06/27/13   Elpidio AnisShari Upstill, PA-C    Family History Family History  Problem Relation Age of Onset  . Breast cancer Mother     breast  . Hyperlipidemia Mother   . Colon cancer Maternal Aunt 34    colon  . Lung cancer Maternal Grandfather     lung  . Diabetes Maternal Grandmother   . Hypertension Brother   . Heart attack Neg Hx     Social History Social History  Substance Use Topics  . Smoking status: Never Smoker  . Smokeless tobacco: Never Used  . Alcohol use No     Allergies   Patient has no known allergies.   Review of Systems Review of Systems  Gastrointestinal: Positive for anal bleeding, blood in stool and rectal pain. Negative for abdominal pain, constipation,  diarrhea, nausea and vomiting.     Physical Exam Updated Vital Signs BP (!) 139/96   Pulse 83   Temp 98.9 F (37.2 C) (Oral)   Resp 16   Ht 5\' 5"  (1.651 m)   Wt 117.5 kg   LMP 08/06/2016   SpO2 100%   BMI 43.10 kg/m   Physical Exam  Constitutional: She is oriented to person, place, and time. She appears well-developed and well-nourished. No distress.  HENT:  Head: Normocephalic and atraumatic.  Eyes: Conjunctivae are normal. Pupils are equal, round, and reactive to light. Right eye exhibits no discharge. Left eye exhibits no discharge. No scleral icterus.  Neck: Normal range of motion.  Cardiovascular: Normal rate.   Pulmonary/Chest: Effort normal. No respiratory distress.  Abdominal: Soft. Bowel sounds are normal. She exhibits no distension and no mass. There  is no tenderness. There is no rebound and no guarding. No hernia.  Genitourinary:  Genitourinary Comments: Rectal: Gross bright red blood and external hemorrhoid noted which is tender to palpation. Digital exam deferred. No fissures, redness, area of fluctuance, lesions. Chaperone present during exam.   Neurological: She is alert and oriented to person, place, and time.  Skin: Skin is warm and dry.  Psychiatric: She has a normal mood and affect. Her behavior is normal.  Nursing note and vitals reviewed.    ED Treatments / Results  Labs (all labs ordered are listed, but only abnormal results are displayed) Labs Reviewed - No data to display  EKG  EKG Interpretation None       Radiology No results found.  Procedures Procedures (including critical care time)  Medications Ordered in ED Medications - No data to display   Initial Impression / Assessment and Plan / ED Course  I have reviewed the triage vital signs and the nursing notes.  Pertinent labs & imaging results that were available during my care of the patient were reviewed by me and considered in my medical decision making (see chart for details).  31 year old female with rectal bleeding and hemorrhoids. She is hypertensive but otherwise vital signs are normal. Rectal exam remarkable for obvious bleeding and hemorrhoid. Advised sitz baths and Anusol as needed. Recommend follow-up with general surgery if symptoms persist. Return precautions given.  Final Clinical Impressions(s) / ED Diagnoses   Final diagnoses:  Rectal bleeding  Hemorrhoids, unspecified hemorrhoid type    New Prescriptions New Prescriptions   No medications on file     Bethel Born, PA-C 08/12/16 1642    Loren Racer, MD 08/18/16 1600

## 2016-08-15 ENCOUNTER — Other Ambulatory Visit: Payer: Self-pay | Admitting: Obstetrics & Gynecology

## 2016-08-17 LAB — CYTOLOGY - PAP

## 2017-06-23 DIAGNOSIS — R5383 Other fatigue: Secondary | ICD-10-CM | POA: Diagnosis not present

## 2017-08-29 DIAGNOSIS — B372 Candidiasis of skin and nail: Secondary | ICD-10-CM | POA: Diagnosis not present

## 2017-09-18 DIAGNOSIS — R8290 Unspecified abnormal findings in urine: Secondary | ICD-10-CM | POA: Diagnosis not present

## 2017-09-18 DIAGNOSIS — Z01419 Encounter for gynecological examination (general) (routine) without abnormal findings: Secondary | ICD-10-CM | POA: Diagnosis not present

## 2017-09-18 DIAGNOSIS — M2392 Unspecified internal derangement of left knee: Secondary | ICD-10-CM | POA: Insufficient documentation

## 2017-09-18 DIAGNOSIS — M2242 Chondromalacia patellae, left knee: Secondary | ICD-10-CM | POA: Diagnosis not present

## 2017-09-18 DIAGNOSIS — M238X2 Other internal derangements of left knee: Secondary | ICD-10-CM | POA: Diagnosis not present

## 2017-10-09 ENCOUNTER — Ambulatory Visit: Payer: 59

## 2017-10-17 ENCOUNTER — Ambulatory Visit: Payer: 59 | Attending: Orthopedic Surgery

## 2017-11-13 DIAGNOSIS — Z0000 Encounter for general adult medical examination without abnormal findings: Secondary | ICD-10-CM | POA: Diagnosis not present

## 2017-11-13 DIAGNOSIS — R609 Edema, unspecified: Secondary | ICD-10-CM | POA: Diagnosis not present

## 2017-11-25 ENCOUNTER — Ambulatory Visit: Payer: 59 | Admitting: Podiatry

## 2017-11-25 ENCOUNTER — Encounter: Payer: Self-pay | Admitting: Podiatry

## 2017-11-25 ENCOUNTER — Ambulatory Visit (INDEPENDENT_AMBULATORY_CARE_PROVIDER_SITE_OTHER): Payer: 59

## 2017-11-25 DIAGNOSIS — M722 Plantar fascial fibromatosis: Secondary | ICD-10-CM | POA: Diagnosis not present

## 2017-11-25 MED ORDER — MELOXICAM 15 MG PO TABS: 15.0000 mg | ORAL_TABLET | Freq: Every day | ORAL | 1 refills | Status: AC

## 2017-11-28 ENCOUNTER — Ambulatory Visit: Payer: Self-pay | Admitting: Podiatry

## 2017-11-29 NOTE — Progress Notes (Signed)
   Subjective: 32 year old female presenting today as a new patient with a chief complaint of sharp pain to the left foot and heel that began 2-3 weeks ago. She states the pain is worse in the morning when she first gets out of bed. Walking for extended periods of time also increases the pain. She has not done anything for treatment. Patient is here for further evaluation and treatment.   Past Medical History:  Diagnosis Date  . Abnormal Pap smear 07   colposcopy with biopsy  . Anemia   . Herpes simplex   . Obesity      Objective: Physical Exam General: The patient is alert and oriented x3 in no acute distress.  Dermatology: Skin is warm, dry and supple bilateral lower extremities. Negative for open lesions or macerations bilateral.   Vascular: Dorsalis Pedis and Posterior Tibial pulses palpable bilateral.  Capillary fill time is immediate to all digits.  Neurological: Epicritic and protective threshold intact bilateral.   Musculoskeletal: Tenderness to palpation to the plantar aspect of the left heel along the plantar fascia. All other joints range of motion within normal limits bilateral. Strength 5/5 in all groups bilateral.   Radiographic exam:   Normal osseous mineralization. Joint spaces preserved. No fracture/dislocation/boney destruction. No other soft tissue abnormalities or radiopaque foreign bodies.   Assessment: 1. Plantar fasciitis left foot  Plan of Care:  1. Patient evaluated. Xrays reviewed.   2. Injection of 0.5cc Celestone soluspan injected into the left plantar fascia.  3. Prescription for meloxicam provided to patient. 4. Plantar fascial band(s) dispensed  5. Instructed patient regarding therapies and modalities at home to alleviate symptoms.  6. Return to clinic in 4 weeks.     Felecia ShellingBrent M. Adaleah Forget, DPM [facility]  Dr. Felecia ShellingBrent M. Rylan Bernard, DPM    [address]                                     [place], [place] [address]                Office  [phone]  Fax [phone]

## 2017-12-25 ENCOUNTER — Ambulatory Visit: Payer: 59 | Admitting: Podiatry

## 2018-01-01 DIAGNOSIS — Z3200 Encounter for pregnancy test, result unknown: Secondary | ICD-10-CM | POA: Diagnosis not present

## 2018-01-01 DIAGNOSIS — R002 Palpitations: Secondary | ICD-10-CM | POA: Diagnosis not present

## 2018-01-02 DIAGNOSIS — R002 Palpitations: Secondary | ICD-10-CM | POA: Diagnosis not present

## 2018-01-02 DIAGNOSIS — R5383 Other fatigue: Secondary | ICD-10-CM | POA: Diagnosis not present

## 2018-01-03 ENCOUNTER — Encounter: Payer: Self-pay | Admitting: Cardiology

## 2018-01-11 NOTE — Progress Notes (Signed)
Erasmo Downer MD Reason for referral-Palpitations  HPI: 32 yo female for evaluation of palpitations at request of Annamaria Helling MD. patient has had intermittent palpitations for years by her report.  They are sudden in onset and described as heart racing.  Last for minutes and slowly resolved.  There is no associated chest pain or syncope but there is dyspnea.  Not related to activities.  She also for the past 2 weeks has had occasional chest pain.  The pain is in the left breast area without radiation or associated symptoms.  Can increase with inspiration.  No associated symptoms.  She denies exertional chest pain.  She does note dyspnea on exertion.  Finally she states that she was told she had a leaking valve when she was a child.  Cardiology asked to evaluate.  Current Outpatient Medications  Medication Sig Dispense Refill  . furosemide (LASIX) 20 MG tablet Take 20 mg by mouth.     No current facility-administered medications for this visit.     Allergies  Allergen Reactions  . Peanut Oil Other (See Comments)    Had testing but never had a real reaction to peanuts.     Past Medical History:  Diagnosis Date  . Abnormal Pap smear 07   colposcopy with biopsy  . Anemia   . Herpes simplex   . Hypothyroid   . Obesity     Past Surgical History:  Procedure Laterality Date  . ANKLE SURGERY  02/2009  . BREAST CYST EXCISION  <42yr  . PILONIDAL CYST EXCISION    . TUBAL LIGATION  01/14/2011   Procedure: POST PARTUM TUBAL LIGATION;  Surgeon: Caprice Beaver;  Location: [facility];  Service: Gynecology;  Laterality: Bilateral;    Social History   Socioeconomic History  . Marital status: Married    Spouse name: Not on file  . Number of children: 3  . Years of education: Not on file  . Highest education level: Not on file  Occupational History  . Occupation: Lobbyist: [employer]  Social Needs  . Financial resource strain: Not on file  . Food insecurity:   Worry: Not on file    Inability: Not on file  . Transportation needs:    Medical: Not on file    Non-medical: Not on file  Tobacco Use  . Smoking status: Never Smoker  . Smokeless tobacco: Never Used  Substance and Sexual Activity  . Alcohol use: No  . Drug use: No  . Sexual activity: Yes    Partners: Male    Birth control/protection: None  Lifestyle  . Physical activity:    Days per week: Not on file    Minutes per session: Not on file  . Stress: Not on file  Relationships  . Social connections:    Talks on phone: Not on file    Gets together: Not on file    Attends religious service: Not on file    Active member of club or organization: Not on file    Attends meetings of clubs or organizations: Not on file    Relationship status: Not on file  . Intimate partner violence:    Fear of current or ex partner: Not on file    Emotionally abused: Not on file    Physically abused: Not on file    Forced sexual activity: Not on file  Other Topics Concern  . Not on file  Social History Narrative  . Not on file  Family History  Problem Relation Age of Onset  . Breast cancer Mother        breast  . Hyperlipidemia Mother   . Colon cancer Maternal Aunt 34       colon  . Lung cancer Maternal Grandfather        lung  . Diabetes Maternal Grandmother   . Hypertension Brother   . Heart attack Neg Hx     ROS: no fevers or chills, productive cough, hemoptysis, dysphasia, odynophagia, melena, hematochezia, dysuria, hematuria, rash, seizure activity, orthopnea, PND, pedal edema, claudication. Remaining systems are negative.  Physical Exam:   Blood pressure 128/88, pulse 82, height 5\' 5"  (1.651 m), weight 286 lb 6.4 oz (129.9 kg).  General:  Well developed/obese in NAD Skin warm/dry Patient not depressed No peripheral clubbing Back-normal HEENT-normal/normal eyelids Neck supple/normal carotid upstroke bilaterally; no bruits; no JVD; no thyromegaly chest - CTA/ normal  expansion CV - RRR/normal S1 and S2; no murmurs, rubs or gallops;  PMI nondisplaced Abdomen -NT/ND, no HSM, no mass, + bowel sounds, no bruit 2+ femoral pulses, no bruits Ext-no edema, chords, 2+ DP Neuro-grossly nonfocal  ECG -sinus rhythm at a rate of 82, nonspecific ST changes.  Personally reviewed  A/P  1 chest pain-symptoms are atypical.  I will arrange an exercise treadmill for risk stratification.  2 dyspnea-we will arrange an echocardiogram to assess LV function.  No recent travel or leg injury.  No recent surgeries.  3 palpitations-patient is in sinus rhythm today.  I will arrange an event monitor to further assess.  We can consider a beta-blocker as needed.  Olga Millers, MD

## 2018-01-17 ENCOUNTER — Ambulatory Visit (INDEPENDENT_AMBULATORY_CARE_PROVIDER_SITE_OTHER): Payer: 59 | Admitting: Cardiology

## 2018-01-17 ENCOUNTER — Encounter: Payer: Self-pay | Admitting: Cardiology

## 2018-01-17 VITALS — BP 128/88 | HR 82 | Ht 65.0 in | Wt 286.4 lb

## 2018-01-17 DIAGNOSIS — R002 Palpitations: Secondary | ICD-10-CM | POA: Diagnosis not present

## 2018-01-17 DIAGNOSIS — R072 Precordial pain: Secondary | ICD-10-CM

## 2018-01-17 DIAGNOSIS — R0600 Dyspnea, unspecified: Secondary | ICD-10-CM

## 2018-01-17 NOTE — Patient Instructions (Signed)
Medication Instructions:   NO CHANGE  Testing/Procedures:  Your physician has recommended that you wear a 30 DAYevent monitor. Event monitors are medical devices that record the heart's electrical activity. Doctors most often [place] these monitors to diagnose arrhythmias. Arrhythmias are problems with the speed or rhythm of the heartbeat. The monitor is a small, portable device. You can wear one while you do your normal daily activities. This is usually used to diagnose what is causing palpitations/syncope (passing out).   Your physician has requested that you have an echocardiogram. Echocardiography is a painless test that uses sound waves to create images of your heart. It provides your doctor with information about the size and shape of your heart and how well your heart's chambers and valves are working. This procedure takes approximately one hour. There are no restrictions for this procedure.  Your physician has requested that you have an exercise tolerance test. For further information please visit [email or web address] Please also follow instruction sheet, as given.   Follow-Up:  Your physician recommends that you schedule a follow-up appointment in:  6 WEEKS WITH DR Jens Som

## 2018-01-24 ENCOUNTER — Ambulatory Visit (INDEPENDENT_AMBULATORY_CARE_PROVIDER_SITE_OTHER): Payer: 59

## 2018-01-24 ENCOUNTER — Ambulatory Visit (HOSPITAL_COMMUNITY): Payer: 59 | Attending: Cardiology

## 2018-01-24 ENCOUNTER — Other Ambulatory Visit: Payer: Self-pay

## 2018-01-24 DIAGNOSIS — R002 Palpitations: Secondary | ICD-10-CM

## 2018-01-25 LAB — EXERCISE TOLERANCE TEST
CHL CUP RESTING HR STRESS: 83 {beats}/min
CHL RATE OF PERCEIVED EXERTION: 17
CSEPEDS: 19 s
CSEPEW: 9 METS
CSEPHR: 87 %
Exercise duration (min): 7 min
MPHR: 187 {beats}/min
Peak HR: 164 {beats}/min

## 2018-02-07 ENCOUNTER — Encounter: Payer: Self-pay | Admitting: *Deleted

## 2018-02-22 NOTE — Progress Notes (Deleted)
HPI: FU palpitations.   Exercise treadmill October 2019 normal.  Echocardiogram October 2019 showed normal LV function.  Event monitor October 2019 showed . Since last seen,   Current Outpatient Medications  Medication Sig Dispense Refill  . furosemide (LASIX) 20 MG tablet Take 20 mg by mouth.     No current facility-administered medications for this visit.      Past Medical History:  Diagnosis Date  . Abnormal Pap smear 07   colposcopy with biopsy  . Anemia   . Herpes simplex   . Hypothyroid   . Obesity     Past Surgical History:  Procedure Laterality Date  . ANKLE SURGERY  02/2009  . BREAST CYST EXCISION  <77yr  . PILONIDAL CYST EXCISION    . TUBAL LIGATION  01/14/2011   Procedure: POST PARTUM TUBAL LIGATION;  Surgeon: Caprice Beaver;  Location: [facility];  Service: Gynecology;  Laterality: Bilateral;    Social History   Socioeconomic History  . Marital status: Married    Spouse name: Not on file  . Number of children: 3  . Years of education: Not on file  . Highest education level: Not on file  Occupational History  . Occupation: Lobbyist: [employer]  Social Needs  . Financial resource strain: Not on file  . Food insecurity:    Worry: Not on file    Inability: Not on file  . Transportation needs:    Medical: Not on file    Non-medical: Not on file  Tobacco Use  . Smoking status: Never Smoker  . Smokeless tobacco: Never Used  Substance and Sexual Activity  . Alcohol use: No  . Drug use: No  . Sexual activity: Yes    Partners: Male    Birth control/protection: None  Lifestyle  . Physical activity:    Days per week: Not on file    Minutes per session: Not on file  . Stress: Not on file  Relationships  . Social connections:    Talks on phone: Not on file    Gets together: Not on file    Attends religious service: Not on file    Active member of club or organization: Not on file    Attends meetings of clubs or organizations: Not on  file    Relationship status: Not on file  . Intimate partner violence:    Fear of current or ex partner: Not on file    Emotionally abused: Not on file    Physically abused: Not on file    Forced sexual activity: Not on file  Other Topics Concern  . Not on file  Social History Narrative  . Not on file    Family History  Problem Relation Age of Onset  . Breast cancer Mother   . Hyperlipidemia Mother   . Colon cancer Maternal Aunt 34  . Lung cancer Maternal Grandfather   . Diabetes Maternal Grandmother   . Hypertension Brother   . Heart attack Neg Hx     ROS: no fevers or chills, productive cough, hemoptysis, dysphasia, odynophagia, melena, hematochezia, dysuria, hematuria, rash, seizure activity, orthopnea, PND, pedal edema, claudication. Remaining systems are negative.  Physical Exam: Well-developed well-nourished in no acute distress.  Skin is warm and dry.  HEENT is normal.  Neck is supple.  Chest is clear to auscultation with normal expansion.  Cardiovascular exam is regular rate and rhythm.  Abdominal exam nontender or distended. No masses palpated. Extremities  show no edema. neuro grossly intact  ECG- personally reviewed  A/P  1 chest pain-previous symptoms are atypical.  Exercise treadmill negative.  No plans for further ischemia evaluation.  2 dyspnea-echocardiogram showed normal LV function.  No plans for further evaluation at this point.  3 palpitations-  Olga MillersBrian Arpita Fentress, MD

## 2018-02-28 ENCOUNTER — Ambulatory Visit: Payer: 59 | Admitting: Cardiology

## 2018-03-02 ENCOUNTER — Ambulatory Visit: Payer: 59 | Admitting: Cardiology

## 2018-03-02 ENCOUNTER — Telehealth: Payer: Self-pay | Admitting: Cardiology

## 2018-03-02 NOTE — Telephone Encounter (Signed)
New Message   Patient is calling to obtain holter monitor results. Please call to discuss.

## 2018-03-02 NOTE — Telephone Encounter (Signed)
Called patient, advised that the 30 day monitor was just up, and we did not have the results in yet. Nurse would call once received and reviewed by MD.  Patient verbalized understanding.

## 2018-04-23 DIAGNOSIS — H5213 Myopia, bilateral: Secondary | ICD-10-CM | POA: Diagnosis not present

## 2018-04-23 DIAGNOSIS — H52223 Regular astigmatism, bilateral: Secondary | ICD-10-CM | POA: Diagnosis not present

## 2018-06-05 ENCOUNTER — Other Ambulatory Visit: Payer: Self-pay

## 2018-06-11 DIAGNOSIS — Z803 Family history of malignant neoplasm of breast: Secondary | ICD-10-CM | POA: Diagnosis not present

## 2018-06-11 DIAGNOSIS — M79629 Pain in unspecified upper arm: Secondary | ICD-10-CM | POA: Diagnosis not present

## 2018-06-13 ENCOUNTER — Other Ambulatory Visit: Payer: Self-pay | Admitting: Obstetrics & Gynecology

## 2018-06-13 DIAGNOSIS — M79621 Pain in right upper arm: Secondary | ICD-10-CM

## 2018-06-21 ENCOUNTER — Other Ambulatory Visit: Payer: Self-pay

## 2018-06-21 ENCOUNTER — Ambulatory Visit
Admission: RE | Admit: 2018-06-21 | Discharge: 2018-06-21 | Disposition: A | Discharge status: Home / Self Care | Payer: 59 | Source: Ambulatory Visit | Attending: Obstetrics & Gynecology | Admitting: Obstetrics & Gynecology

## 2018-06-21 ENCOUNTER — Other Ambulatory Visit: Payer: Self-pay | Admitting: Obstetrics & Gynecology

## 2018-06-21 DIAGNOSIS — Z803 Family history of malignant neoplasm of breast: Secondary | ICD-10-CM | POA: Diagnosis not present

## 2018-06-21 DIAGNOSIS — R2231 Localized swelling, mass and lump, right upper limb: Secondary | ICD-10-CM

## 2018-06-21 DIAGNOSIS — R928 Other abnormal and inconclusive findings on diagnostic imaging of breast: Secondary | ICD-10-CM | POA: Diagnosis not present

## 2018-06-21 DIAGNOSIS — M79621 Pain in right upper arm: Secondary | ICD-10-CM

## 2018-06-21 DIAGNOSIS — N6489 Other specified disorders of breast: Secondary | ICD-10-CM | POA: Diagnosis not present

## 2019-01-01 DIAGNOSIS — R51 Headache: Secondary | ICD-10-CM | POA: Diagnosis not present

## 2019-01-01 DIAGNOSIS — Z124 Encounter for screening for malignant neoplasm of cervix: Secondary | ICD-10-CM | POA: Diagnosis not present

## 2019-01-01 DIAGNOSIS — Z01419 Encounter for gynecological examination (general) (routine) without abnormal findings: Secondary | ICD-10-CM | POA: Diagnosis not present

## 2019-05-01 DIAGNOSIS — H52223 Regular astigmatism, bilateral: Secondary | ICD-10-CM | POA: Diagnosis not present

## 2019-05-01 DIAGNOSIS — H5213 Myopia, bilateral: Secondary | ICD-10-CM | POA: Diagnosis not present

## 2019-06-18 DIAGNOSIS — L821 Other seborrheic keratosis: Secondary | ICD-10-CM | POA: Diagnosis not present

## 2019-06-18 DIAGNOSIS — L918 Other hypertrophic disorders of the skin: Secondary | ICD-10-CM | POA: Diagnosis not present

## 2019-07-16 ENCOUNTER — Ambulatory Visit (INDEPENDENT_AMBULATORY_CARE_PROVIDER_SITE_OTHER): Payer: 59 | Admitting: Family Medicine

## 2019-07-16 ENCOUNTER — Encounter: Payer: Self-pay | Admitting: Family Medicine

## 2019-07-16 ENCOUNTER — Ambulatory Visit (INDEPENDENT_AMBULATORY_CARE_PROVIDER_SITE_OTHER): Payer: 59

## 2019-07-16 ENCOUNTER — Other Ambulatory Visit: Payer: Self-pay

## 2019-07-16 VITALS — BP 110/80 | HR 76 | Ht 65.0 in | Wt 274.0 lb

## 2019-07-16 DIAGNOSIS — M7530 Calcific tendinitis of unspecified shoulder: Secondary | ICD-10-CM

## 2019-07-16 DIAGNOSIS — G8929 Other chronic pain: Secondary | ICD-10-CM | POA: Diagnosis not present

## 2019-07-16 DIAGNOSIS — M25512 Pain in left shoulder: Secondary | ICD-10-CM

## 2019-07-16 NOTE — Patient Instructions (Signed)
Duexis 1 bill 3x a day for 3 days Vit D Once weekly See me in 5-6 weeks

## 2019-07-16 NOTE — Assessment & Plan Note (Signed)
New problem.  Discussed vitamin D supplementation, icing regimen, home exercise, topical anti-inflammatories, encouraged good ergonomics.  Work with Event organiser.  Follow-up again in 4 to 6 weeks worsening pain will consider formal physical therapy and injections

## 2019-07-16 NOTE — Progress Notes (Signed)
Misty Mathis [address] [place] Phone: [phone] Subjective:   I Misty Mathis am serving as a Education administrator for Dr. Hulan Saas.  This visit occurred during the SARS-CoV-2 public health emergency.  Safety protocols were in place, including screening questions prior to the visit, additional usage of staff PPE, and extensive cleaning of exam room while observing appropriate contact time as indicated for disinfecting solutions.   I'm seeing this patient by the request  of:  Vania Rea, MD  CC: Left shoulder pain  GUR:[id number]  Misty Mathis is a 34 y.o. female coming in with complaint of left shoulder pain. Patient states the shoulder has been painful for a week. Deltoid soreness like a muscle pull. ROM is limited. Shoulder has been popping. History of rotator cuff tear on the right side. Pain is worse in the morning after sleeping.  5/10 at its worse. Ibuprofen for pain this morning.      Past Medical History:  Diagnosis Date  . Abnormal Pap smear 07   colposcopy with biopsy  . Anemia   . Herpes simplex   . Hypothyroid   . Obesity    Past Surgical History:  Procedure Laterality Date  . ANKLE SURGERY  02/2009  . BREAST CYST EXCISION  <20yr  . PILONIDAL CYST EXCISION    . TUBAL LIGATION  01/14/2011   Procedure: POST PARTUM TUBAL LIGATION;  Surgeon: Paulo Fruit;  Location: [facility];  Service: Gynecology;  Laterality: Bilateral;   Social History   Socioeconomic History  . Marital status: Married    Spouse name: Not on file  . Number of children: 3  . Years of education: Not on file  . Highest education level: Not on file  Occupational History  . Occupation: Surveyor, quantity: [employer]  Tobacco Use  . Smoking status: Never Smoker  . Smokeless tobacco: Never Used  Substance and Sexual Activity  . Alcohol use: No  . Drug use: No  . Sexual activity: Yes    Partners: Male    Birth control/protection: None    Other Topics Concern  . Not on file  Social History Narrative  . Not on file   Social Determinants of Health   Financial Resource Strain:   . Difficulty of Paying Living Expenses:   Food Insecurity:   . Worried About Charity fundraiser in the Last Year:   . Arboriculturist in the Last Year:   Transportation Needs:   . Film/video editor (Medical):   Marland Kitchen Lack of Transportation (Non-Medical):   Physical Activity:   . Days of Exercise per Week:   . Minutes of Exercise per Session:   Stress:   . Feeling of Stress :   Social Connections:   . Frequency of Communication with Friends and Family:   . Frequency of Social Gatherings with Friends and Family:   . Attends Religious Services:   . Active Member of Clubs or Organizations:   . Attends Archivist Meetings:   Marland Kitchen Marital Status:    Allergies  Allergen Reactions  . Peanut Oil Other (See Comments)    Had testing but never had a real reaction to peanuts.   Family History  Problem Relation Age of Onset  . Breast cancer Mother 23  . Hyperlipidemia Mother   . Colon cancer Maternal Aunt 34  . Lung cancer Maternal Grandfather   . Diabetes Maternal Grandmother   . Hypertension  Brother   . Heart attack Neg Hx      Current Outpatient Medications (Cardiovascular):  .  furosemide (LASIX) 20 MG tablet, Take 20 mg by mouth.       Reviewed prior external information including notes and imaging from  primary care provider As well as notes that were available from care everywhere and other healthcare systems.  Past medical history, social, surgical and family history all reviewed in electronic medical record.  No pertanent information unless stated regarding to the chief complaint.   Review of Systems:  No headache, visual changes, nausea, vomiting, diarrhea, constipation, dizziness, abdominal pain, skin rash, fevers, chills, night sweats, weight loss, swollen lymph nodes, body aches, joint swelling, chest pain,  shortness of breath, mood changes. POSITIVE muscle aches  Objective  Blood pressure 110/80, pulse 76, height 5\' 5"  (1.651 m), weight 274 lb (124.3 kg), SpO2 94 %.   General: No apparent distress alert and oriented x3 mood and affect normal, dressed appropriately.  HEENT: Pupils equal, extraocular movements intact  Respiratory: Patient's speak in full sentences and does not appear short of breath  Cardiovascular: No lower extremity edema, non tender, no erythema  Neuro: Cranial nerves II through XII are intact, neurovascularly intact in all extremities with 2+ DTRs and 2+ pulses.  Gait normal with good balance and coordination.  MSK:  Non tender with full range of motion and good stability and symmetric strength and tone of shoulders, elbows, wrist, hip, knee and ankles bilaterally.  Shoulder: left Inspection reveals no abnormalities, atrophy or asymmetry. Palpation is normal with no tenderness over AC joint or bicipital groove. ROM is full in all planes passively. Rotator cuff strength normal throughout. signs of impingement with positive Neer and Hawkin's tests, but negative empty can sign. Speeds and Yergason's tests normal. No labral pathology noted with negative Obrien's, negative clunk and good stability. Normal scapular function observed. No painful arc and no drop arm sign. No apprehension sign  MSK performed of: left This study was ordered, performed, and interpreted by [place] D.O.  Shoulder:   Supraspinatus:  Appears normal on long and transverse views, Bursal bulge seen with shoulder abduction on impingement view.  Calcific changes of the bursa Infraspinatus:  Appears normal on long and transverse views. Significant increase in Doppler flow Subscapularis:  Appears normal on long and transverse views. Positive bursa Teres Minor:  Appears normal on long and transverse views. AC joint:  Capsule undistended, no geyser sign. Glenohumeral Joint:  Appears normal without  effusion. Glenoid Labrum:  Intact without visualized tears. Biceps Tendon:  Appears normal on long and transverse views, no fraying of tendon, tendon located in intertubercular groove, no subluxation with shoulder internal or external rotation.  Impression: Subacromial bursitis calcific changes  97110; 15 additional minutes spent for Therapeutic exercises as stated in above notes.  This included exercises focusing on stretching, strengthening, with significant focus on eccentric aspects.   Long term goals include an improvement in range of motion, strength, endurance as well as avoiding reinjury. Patient's frequency would include in 1-2 times a day, 3-5 times a week for a duration of 6-12 weeks.  Shoulder Exercises that included:  Basic scapular stabilization to include adduction and depression of scapula Scaption, focusing on proper movement and good control Internal and External rotation utilizing a theraband, with elbow tucked at side entire time Rows with theraband  Proper technique shown and discussed handout in great detail with ATC.  All questions were discussed and answered.  Impression and Recommendations:     This case required medical decision making of moderate complexity. The above documentation has been reviewed and is accurate and complete Judi Saa, DO       Note: This dictation was prepared with Dragon dictation along with smaller phrase technology. Any transcriptional errors that result from this process are unintentional.         t

## 2019-07-17 ENCOUNTER — Other Ambulatory Visit: Payer: Self-pay

## 2019-07-17 MED ORDER — VITAMIN D (ERGOCALCIFEROL) 1.25 MG (50000 UNIT) PO CAPS: 50000.0000 [IU] | ORAL_CAPSULE | ORAL | 0 refills | Status: DC

## 2019-08-01 ENCOUNTER — Other Ambulatory Visit: Payer: Self-pay

## 2019-08-01 ENCOUNTER — Encounter: Payer: Self-pay | Admitting: Internal Medicine

## 2019-08-01 ENCOUNTER — Ambulatory Visit (INDEPENDENT_AMBULATORY_CARE_PROVIDER_SITE_OTHER): Payer: 59 | Admitting: Internal Medicine

## 2019-08-01 VITALS — BP 152/100 | HR 70 | Temp 98.6°F | Ht 65.0 in | Wt 275.0 lb

## 2019-08-01 DIAGNOSIS — R030 Elevated blood-pressure reading, without diagnosis of hypertension: Secondary | ICD-10-CM | POA: Diagnosis not present

## 2019-08-01 DIAGNOSIS — Z800 Family history of malignant neoplasm of digestive organs: Secondary | ICD-10-CM | POA: Diagnosis not present

## 2019-08-01 LAB — CBC
HCT: 37.3 % (ref 36.0–46.0)
Hemoglobin: 12.5 g/dL (ref 12.0–15.0)
MCHC: 33.6 g/dL (ref 30.0–36.0)
MCV: 88.9 fl (ref 78.0–100.0)
Platelets: 318 10*3/uL (ref 150.0–400.0)
RBC: 4.19 Mil/uL (ref 3.87–5.11)
RDW: 15.3 % (ref 11.5–15.5)
WBC: 5.6 10*3/uL (ref 4.0–10.5)

## 2019-08-01 LAB — COMPREHENSIVE METABOLIC PANEL
ALT: 11 U/L (ref 0–35)
AST: 15 U/L (ref 0–37)
Albumin: 4.1 g/dL (ref 3.5–5.2)
Alkaline Phosphatase: 46 U/L (ref 39–117)
BUN: 10 mg/dL (ref 6–23)
CO2: 32 mEq/L (ref 19–32)
Calcium: 9.2 mg/dL (ref 8.4–10.5)
Chloride: 102 mEq/L (ref 96–112)
Creatinine, Ser: 0.85 mg/dL (ref 0.40–1.20)
GFR: 91.9 mL/min (ref >60.00)
Glucose, Bld: 89 mg/dL (ref 70–99)
Potassium: 3.9 mEq/L (ref 3.5–5.1)
Sodium: 138 mEq/L (ref 135–145)
Total Bilirubin: 0.3 mg/dL (ref 0.2–1.2)
Total Protein: 7.4 g/dL (ref 6.0–8.3)

## 2019-08-01 LAB — LIPID PANEL
Cholesterol: 223 mg/dL — ABNORMAL HIGH (ref 0–200)
HDL: 54.4 mg/dL (ref >39.00)
LDL Cholesterol: 154 mg/dL — ABNORMAL HIGH (ref 0–99)
NonHDL: 168.78
Total CHOL/HDL Ratio: 4
Triglycerides: 73 mg/dL (ref 0.0–149.0)
VLDL: 14.6 mg/dL (ref 0.0–40.0)

## 2019-08-01 LAB — HEMOGLOBIN A1C: Hgb A1c MFr Bld: 5.6 % (ref 4.6–6.5)

## 2019-08-01 MED ORDER — KETOROLAC TROMETHAMINE 30 MG/ML IJ SOLN
30.0000 mg | Freq: Once | INTRAMUSCULAR | Status: AC
  Administered 2019-08-01: 30 mg via INTRAMUSCULAR

## 2019-08-01 MED ORDER — AMLODIPINE BESYLATE 10 MG PO TABS: 10.0000 mg | ORAL_TABLET | Freq: Every day | ORAL | 3 refills | Status: DC

## 2019-08-01 NOTE — Assessment & Plan Note (Signed)
Checking lipid panel and HgA1c, she is not sure if these have been checked recently.

## 2019-08-01 NOTE — Patient Instructions (Signed)
We have sent in amlodipine to take 1 pill daily for blood pressure.    DASH Eating Plan DASH stands for "Dietary Approaches to Stop Hypertension." The DASH eating plan is a healthy eating plan that has been shown to reduce high blood pressure (hypertension). It may also reduce your risk for type 2 diabetes, heart disease, and stroke. The DASH eating plan may also help with weight loss. What are tips for following this plan?  General guidelines  Avoid eating more than 2,300 mg (milligrams) of salt (sodium) a day. If you have hypertension, you may need to reduce your sodium intake to 1,500 mg a day.  Limit alcohol intake to no more than 1 drink a day for nonpregnant women and 2 drinks a day for men. One drink equals 12 oz of beer, 5 oz of wine, or 1 oz of hard liquor.  Work with your health care provider to maintain a healthy body weight or to lose weight. Ask what an ideal weight is for you.  Get at least 30 minutes of exercise that causes your heart to beat faster (aerobic exercise) most days of the week. Activities may include walking, swimming, or biking.  Work with your health care provider or diet and nutrition specialist (dietitian) to adjust your eating plan to your individual calorie needs. Reading food labels   Check food labels for the amount of sodium per serving. Choose foods with less than 5 percent of the Daily Value of sodium. Generally, foods with less than 300 mg of sodium per serving fit into this eating plan.  To find whole grains, look for the word "whole" as the first word in the ingredient list. Shopping  Buy products labeled as "low-sodium" or "no salt added."  Buy fresh foods. Avoid canned foods and premade or frozen meals. Cooking  Avoid adding salt when cooking. Use salt-free seasonings or herbs instead of table salt or sea salt. Check with your health care provider or pharmacist before using salt substitutes.  Do not fry foods. Cook foods using healthy  methods such as baking, boiling, grilling, and broiling instead.  Cook with heart-healthy oils, such as olive, canola, soybean, or sunflower oil. Meal planning  Eat a balanced diet that includes: ? 5 or more servings of fruits and vegetables each day. At each meal, try to fill half of your plate with fruits and vegetables. ? Up to 6-8 servings of whole grains each day. ? Less than 6 oz of lean meat, poultry, or fish each day. A 3-oz serving of meat is about the same size as a deck of cards. One egg equals 1 oz. ? 2 servings of low-fat dairy each day. ? A serving of nuts, seeds, or beans 5 times each week. ? Heart-healthy fats. Healthy fats called Omega-3 fatty acids are found in foods such as flaxseeds and coldwater fish, like sardines, salmon, and mackerel.  Limit how much you eat of the following: ? Canned or prepackaged foods. ? Food that is high in trans fat, such as fried foods. ? Food that is high in saturated fat, such as fatty meat. ? Sweets, desserts, sugary drinks, and other foods with added sugar. ? Full-fat dairy products.  Do not salt foods before eating.  Try to eat at least 2 vegetarian meals each week.  Eat more home-cooked food and less restaurant, buffet, and fast food.  When eating at a restaurant, ask that your food be prepared with less salt or no salt, if possible. What foods  are recommended? The items listed may not be a complete list. Talk with your dietitian about what dietary choices are best for you. Grains Whole-grain or whole-wheat bread. Whole-grain or whole-wheat pasta. Brown rice. [place]ena Morrow. Bulgur. Whole-grain and low-sodium cereals. Pita bread. Low-fat, low-sodium crackers. Whole-wheat flour tortillas. Vegetables Fresh or frozen vegetables (raw, steamed, roasted, or grilled). Low-sodium or reduced-sodium tomato and vegetable juice. Low-sodium or reduced-sodium tomato sauce and tomato paste. Low-sodium or reduced-sodium canned  vegetables. Fruits All fresh, dried, or frozen fruit. Canned fruit in natural juice (without added sugar). Meat and other protein foods Skinless chicken or [place]. Ground chicken or [place]. Pork with fat trimmed off. Fish and seafood. Egg whites. Dried beans, peas, or lentils. Unsalted nuts, nut butters, and seeds. Unsalted canned beans. Lean cuts of beef with fat trimmed off. Low-sodium, lean deli meat. Dairy Low-fat (1%) or fat-free (skim) milk. Fat-free, low-fat, or reduced-fat cheeses. Nonfat, low-sodium ricotta or cottage cheese. Low-fat or nonfat yogurt. Low-fat, low-sodium cheese. Fats and oils Soft margarine without trans fats. Vegetable oil. Low-fat, reduced-fat, or light mayonnaise and salad dressings (reduced-sodium). Canola, safflower, olive, soybean, and sunflower oils. Avocado. Seasoning and other foods Herbs. Spices. Seasoning mixes without salt. Unsalted popcorn and pretzels. Fat-free sweets. What foods are not recommended? The items listed may not be a complete list. Talk with your dietitian about what dietary choices are best for you. Grains Baked goods made with fat, such as croissants, muffins, or some breads. Dry pasta or rice meal packs. Vegetables Creamed or fried vegetables. Vegetables in a cheese sauce. Regular canned vegetables (not low-sodium or reduced-sodium). Regular canned tomato sauce and paste (not low-sodium or reduced-sodium). Regular tomato and vegetable juice (not low-sodium or reduced-sodium). Angie Fava. Olives. Fruits Canned fruit in a light or heavy syrup. Fried fruit. Fruit in cream or butter sauce. Meat and other protein foods Fatty cuts of meat. Ribs. Fried meat. Berniece Salines. Sausage. Bologna and other processed lunch meats. Salami. Fatback. Hotdogs. Bratwurst. Salted nuts and seeds. Canned beans with added salt. Canned or smoked fish. Whole eggs or egg yolks. Chicken or [place] with skin. Dairy Whole or 2% milk, cream, and half-and-half. Whole or full-fat  cream cheese. Whole-fat or sweetened yogurt. Full-fat cheese. Nondairy creamers. Whipped toppings. Processed cheese and cheese spreads. Fats and oils Butter. Stick margarine. Lard. Shortening. Ghee. Bacon fat. Tropical oils, such as coconut, palm kernel, or palm oil. Seasoning and other foods Salted popcorn and pretzels. Onion salt, garlic salt, seasoned salt, table salt, and sea salt. Worcestershire sauce. Tartar sauce. Barbecue sauce. Teriyaki sauce. Soy sauce, including reduced-sodium. Steak sauce. Canned and packaged gravies. Fish sauce. Oyster sauce. Cocktail sauce. Horseradish that you find on the shelf. Ketchup. Mustard. Meat flavorings and tenderizers. Bouillon cubes. Hot sauce and Tabasco sauce. Premade or packaged marinades. Premade or packaged taco seasonings. Relishes. Regular salad dressings. Where to find more information:  National Heart, Lung, and [place]: https://wilson-eaton.com/  American Heart Association: www.heart.org Summary  The DASH eating plan is a healthy eating plan that has been shown to reduce high blood pressure (hypertension). It may also reduce your risk for type 2 diabetes, heart disease, and stroke.  With the DASH eating plan, you should limit salt (sodium) intake to 2,300 mg a day. If you have hypertension, you may need to reduce your sodium intake to 1,500 mg a day.  When on the DASH eating plan, aim to eat more fresh fruits and vegetables, whole grains, lean proteins, low-fat dairy, and heart-healthy fats.  Work with your  health care provider or diet and nutrition specialist (dietitian) to adjust your eating plan to your individual calorie needs. This information is not intended to replace advice given to you by your health care provider. Make sure you discuss any questions you have with your health care provider. Document Revised: 03/10/2017 Document Reviewed: 03/21/2016 Elsevier Patient Education  2020 [employer].

## 2019-08-01 NOTE — Progress Notes (Signed)
   Subjective:   Patient ID: Misty Mathis, female    DOB: [birth year], 34 y.o.   MRN: [id number]  HPI The patient is a new 34 YO female coming in for concerns about blood pressure. Started having headaches about 1 week ago. Checked blood pressure and it was high. This was an automatic cuff so she tried a wrist cuff and it was still high. She has had sporadic high readings in the past but never consistent like this. She has been having daily headaches. Denies chest pains or SOB. Denies abdominal pain. Family history of brother with high blood pressure and he is on dialysis (she is not sure cause but thinks related to a kidney problem). She has not changed diets recently. No new medications. Takes only vitamin D. BP checked about 2 weeks ago and normal. Some intermittent left ankle swelling due to breaking years ago but no change in this recently.   PMH, [facility], social history reviewed and updated  Review of Systems  Constitutional: Negative.   HENT: Negative.   Eyes: Negative.   Respiratory: Negative for cough, chest tightness and shortness of breath.   Cardiovascular: Negative for chest pain, palpitations and leg swelling.  Gastrointestinal: Negative for abdominal distention, abdominal pain, constipation, diarrhea, nausea and vomiting.  Musculoskeletal: Negative.   Skin: Negative.   Neurological: Positive for headaches.  Psychiatric/Behavioral: Negative.     Objective:  Physical Exam Constitutional:      Appearance: She is well-developed.  HENT:     Head: Normocephalic and atraumatic.  Cardiovascular:     Rate and Rhythm: Normal rate and regular rhythm.  Pulmonary:     Effort: Pulmonary effort is normal. No respiratory distress.     Breath sounds: Normal breath sounds. No wheezing or rales.  Abdominal:     General: Bowel sounds are normal. There is no distension.     Palpations: Abdomen is soft.     Tenderness: There is no abdominal tenderness. There is no rebound.   Musculoskeletal:     Cervical back: Normal range of motion.  Skin:    General: Skin is warm and dry.  Neurological:     Mental Status: She is alert and oriented to person, place, and time.     Coordination: Coordination normal.     Vitals:   08/01/19 0852 08/01/19 0854  BP: (!) 148/100 (!) 152/100  Pulse: 70   Temp: 98.6 F (37 C)   SpO2: 99%   Weight: 275 lb (124.7 kg)   Height: 5\' 5"  (1.651 m)    EKG: Rate 68, axis normal, interval normal, sinus, no st or t wave changes  This visit occurred during the SARS-CoV-2 public health emergency.  Safety protocols were in place, including screening questions prior to the visit, additional usage of staff PPE, and extensive cleaning of exam room while observing appropriate contact time as indicated for disinfecting solutions.   Assessment & Plan:  Toradol 30 mg IM

## 2019-08-01 NOTE — Assessment & Plan Note (Addendum)
BP elevated, EKG done which is normal. Checking CMP. Rx amlodipine 10 mg daily and she will work on [employer] if her sodium intake is high at home. She will track this for a few days. Add exercise which she has already started doing. Her husband has high BP so they are already somewhat salt conscious. Toradol 30 mg IM given for headache during visit.

## 2019-08-01 NOTE — Addendum Note (Signed)
Addended by: Milus Mallick on: 08/01/2019 12:22 PM   Modules accepted: Orders

## 2019-08-01 NOTE — Assessment & Plan Note (Signed)
Had colonoscopy 2016 without findings. No other relatives with colon cancer to her knowledge would recommend repeat 2026.

## 2019-08-22 ENCOUNTER — Ambulatory Visit: Payer: 59 | Admitting: Family Medicine

## 2019-08-27 ENCOUNTER — Other Ambulatory Visit: Payer: Self-pay

## 2019-08-27 ENCOUNTER — Ambulatory Visit (INDEPENDENT_AMBULATORY_CARE_PROVIDER_SITE_OTHER): Payer: 59 | Admitting: Family Medicine

## 2019-08-27 ENCOUNTER — Encounter (INDEPENDENT_AMBULATORY_CARE_PROVIDER_SITE_OTHER): Payer: Self-pay | Admitting: Family Medicine

## 2019-08-27 VITALS — BP 115/77 | HR 80 | Temp 98.4°F | Ht 66.0 in | Wt 275.0 lb

## 2019-08-27 DIAGNOSIS — E559 Vitamin D deficiency, unspecified: Secondary | ICD-10-CM

## 2019-08-27 DIAGNOSIS — R7303 Prediabetes: Secondary | ICD-10-CM

## 2019-08-27 DIAGNOSIS — F3289 Other specified depressive episodes: Secondary | ICD-10-CM | POA: Diagnosis not present

## 2019-08-27 DIAGNOSIS — M170 Bilateral primary osteoarthritis of knee: Secondary | ICD-10-CM

## 2019-08-27 DIAGNOSIS — R0602 Shortness of breath: Secondary | ICD-10-CM | POA: Diagnosis not present

## 2019-08-27 DIAGNOSIS — R0683 Snoring: Secondary | ICD-10-CM

## 2019-08-27 DIAGNOSIS — Z6841 Body Mass Index (BMI) 40.0 and over, adult: Secondary | ICD-10-CM

## 2019-08-27 DIAGNOSIS — R5383 Other fatigue: Secondary | ICD-10-CM | POA: Diagnosis not present

## 2019-08-27 DIAGNOSIS — E66813 Obesity, class 3: Secondary | ICD-10-CM

## 2019-08-27 DIAGNOSIS — I10 Essential (primary) hypertension: Secondary | ICD-10-CM

## 2019-08-27 DIAGNOSIS — Z9189 Other specified personal risk factors, not elsewhere classified: Secondary | ICD-10-CM | POA: Diagnosis not present

## 2019-08-27 DIAGNOSIS — E039 Hypothyroidism, unspecified: Secondary | ICD-10-CM | POA: Diagnosis not present

## 2019-08-27 DIAGNOSIS — E7800 Pure hypercholesterolemia, unspecified: Secondary | ICD-10-CM

## 2019-08-27 DIAGNOSIS — Z0289 Encounter for other administrative examinations: Secondary | ICD-10-CM

## 2019-08-27 NOTE — Progress Notes (Signed)
Chief Complaint:   OBESITY Misty Mathis (MR# [id number]) is a 34 y.o. female who presents for evaluation and treatment of obesity and related comorbidities. Current BMI is Body mass index is 44.39 kg/m. Jeraldine has been struggling with her weight for many years and has been unsuccessful in either losing weight, maintaining weight loss, or reaching her healthy weight goal.  Areli is currently in the action stage of change and ready to dedicate time achieving and maintaining a healthier weight. Enrique is interested in becoming our patient and working on intensive lifestyle modifications including (but not limited to) diet and exercise for weight loss.  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  She works 40 hours per week.  She lives with her husband and 3 children (ages 48, 90, and 72).  She says she averages 5,000 steps per day.  She drinks regular sodas and sweet tea.  Her goal weight is 180 pounds.  She says, "I love candy".  Kazoua provided the following diet history today:  Breakfast:  Skips or has oatmeal at work. Lunch:  [employer] lunches. Dinner:  [employer], mac & cheese, or eats out. Snacks:  Candy.  She endorses cravings. Drinking 1 gallon of water per work day and a Publishing copy at dinner.  She says she has already cut down on sodas and tea.  She is okay with Crystal Light.  Devonda's habits were reviewed today and are as follows: Her family eats meals together, she thinks her family will eat healthier with her, her desired weight loss is 97 pounds, she has been heavy most of her life, she started gaining weight after childbirth, her heaviest weight ever was 279 pounds, she is a picky eater, she craves fried foods and candy, she snacks frequently in the evenings, she skips breakfast frequently, she is frequently drinking liquids with calories, she frequently makes poor food choices, she frequently eats larger portions than normal and she struggles with emotional  eating.  Depression Screen Brynnleigh's Food and Mood (modified PHQ-9) score was 8.  Depression screen [facility] 2/9 08/27/2019  Decreased Interest 0  Down, Depressed, Hopeless 0  PHQ - 2 Score 0  Altered sleeping 3  Tired, decreased energy 2  Change in appetite 3  Feeling bad or failure about yourself  0  Trouble concentrating 0  Moving slowly or fidgety/restless 0  Suicidal thoughts 0  PHQ-9 Score 8  Difficult doing work/chores Not difficult at all   Subjective:   1. Other fatigue Doll reports daytime somnolence and admits to waking up still tired. Patent has a history of symptoms of daytime fatigue, morning fatigue, morning headache and snoring. Teonia generally gets 6 or 7 hours of sleep per night, and states that she has generally restful sleep sometimes. Snoring is present. Apneic episodes are not present. Epworth Sleepiness Score is 7.  2. SOB (shortness of breath) on exertion Jodi notes increasing shortness of breath with exercising and seems to be worsening over time with weight gain. She notes getting out of breath sooner with activity than she used to. This has not gotten worse recently. Caralyn denies shortness of breath at rest or orthopnea.  3. Essential hypertension Review: taking medications as instructed, no medication side effects noted, no chest pain on exertion, no dyspnea on exertion, no swelling of ankles.  She is taking amlodipine 10 mg daily.  BP Readings from Last 3 Encounters:  08/27/19 115/77  08/01/19 (!) 152/100  07/16/19 110/80   4.  Vitamin D deficiency She is currently taking prescription vitamin D 50,000 IU each week. She denies nausea, vomiting or muscle weakness.  5. Osteoarthritis of both knees, unspecified osteoarthritis type Hugh has been to [facility] for osteoarthritis of her knees.  6. Snoring Timika had a home sleep study through [facility], but it did not capture.  Epworth score is 7.  Situation Chance of Dozing or Sleeping    Sitting and reading 1 = slight chance of dozing or sleeping  Watching television 0 = would never doze or sleep  Sitting inactive in a public place (theater or meeting) 0 = would never doze or sleep  Sitting as a passenger in a car for an hour 2 = moderate chance of dosing or sleeping  Lying down in the afternoon when circumstances permit 3 = high chance of dozing or sleeping  Sitting and talking to someone 0 = would never doze or sleep  Sitting quietly after lunch without alcohol 1 = slight chance of dozing or sleeping  In a car, while stopped for a few minutes in traffic 0 = would never doze or sleep  TOTAL 7   7. Hypothyroidism, unspecified type Raeleigh has a history of taking levothyroxine 75 mcg daily.  8. Prediabetes Charlee has a diagnosis of prediabetes based on her elevated HgA1c and was informed this puts her at greater risk of developing diabetes. She continues to work on diet and exercise to decrease her risk of diabetes. She denies nausea or hypoglycemia.  Lab Results  Component Value Date   HGBA1C 5.6 08/01/2019   9. Elevated LDL cholesterol level Kelee has an elevated LDL level.  Positive family history of hyperlipidemia.  Lab Results  Component Value Date   ALT 11 08/01/2019   AST 15 08/01/2019   ALKPHOS 46 08/01/2019   BILITOT 0.3 08/01/2019   Lab Results  Component Value Date   CHOL 223 (H) 08/01/2019   HDL 54.40 08/01/2019   LDLCALC 154 (H) 08/01/2019   TRIG 73.0 08/01/2019   CHOLHDL 4 08/01/2019   10. Other depression, with emotional eating Ronnell is struggling with emotional eating and using food for comfort to the extent that it is negatively impacting her health. She has been working on behavior modification techniques to help reduce her emotional eating and has been unsuccessful. She shows no sign of suicidal or homicidal ideations.  Phentermine makes her heart race.  11. At risk for heart disease Neddie is at a higher than average risk for  cardiovascular disease due to obesity.   Assessment/Plan:   1. Other fatigue Jazzy does feel that her weight is causing her energy to be lower than it should be. Fatigue may be related to obesity, depression or many other causes. Labs will be ordered, and in the meanwhile, Ourania will focus on self care including making healthy food choices, increasing physical activity and focusing on stress reduction.  Orders - Anemia panel  2. SOB (shortness of breath) on exertion Meriam does not feel that she gets out of breath more easily that she used to when she exercises. Makylie's shortness of breath appears to be obesity related and exercise induced. She has agreed to work on weight loss and gradually increase exercise to treat her exercise induced shortness of breath. Will continue to monitor closely.  3. Essential hypertension Shemika is working on healthy weight loss and exercise to improve blood pressure control. We will watch for signs of hypotension as she continues her lifestyle modifications.  4.  Vitamin D deficiency Low Vitamin D level contributes to fatigue and are associated with obesity, breast, and colon cancer. She agrees to continue to take prescription Vitamin D _0 ,000 IU every week and will follow-up for routine testing of Vitamin D, at least 2-3 times per year to avoid over-replacement.   Orders - VITAMIN D 25 Hydroxy (Vit-D Deficiency, Fractures)  5. Osteoarthritis of both knees, unspecified osteoarthritis type Will follow because mobility and pain control are important for weight management.  6. Snoring Will place referral to Sleep Medicine for evaluation.  Orders - Ambulatory referral to Neurology  7. Hypothyroidism, unspecified type Will check labs today.  Orders - TSH - T4, free - T3 - Thyroid peroxidase antibody  8. Prediabetes Lulu will continue to work on weight loss, exercise, and decreasing simple carbohydrates to help decrease the risk of  diabetes.   Orders - Insulin, random  9. Elevated LDL cholesterol level Will continue to monitor.  10. Other depression, with emotional eating Behavior modification techniques were discussed today to help Desani deal with her emotional/non-hunger eating behaviors.  Orders and follow up as documented in patient record.   11. At risk for heart disease Zanyia was given approximately 15 minutes of coronary artery disease prevention counseling today. She is 34 y.o. female and has risk factors for heart disease including obesity. We discussed intensive lifestyle modifications today with an emphasis on specific weight loss instructions and strategies.   Repetitive spaced learning was employed today to elicit superior memory formation and behavioral change.  12. Class 3 severe obesity with serious comorbidity and body mass index (BMI) of 40.0 to 44.9 in adult, unspecified obesity type (HCC) Amen is currently in the action stage of change and her goal is to continue with weight loss efforts. I recommend Minerva begin the structured treatment plan as follows:  She has agreed to the Category 1 Plan.  Exercise goals: No exercise has been prescribed at this time.   Behavioral modification strategies: increasing lean protein intake, decreasing simple carbohydrates, increasing vegetables, increasing water intake and decreasing liquid calories.  She was informed of the importance of frequent follow-up visits to maximize her success with intensive lifestyle modifications for her multiple health conditions. She was informed we would discuss her lab results at her next visit unless there is a critical issue that needs to be addressed sooner. Shanvi agreed to keep her next visit at the agreed upon time to discuss these results.  Objective:   Blood pressure 115/77, pulse 80, temperature 98.4 F (36.9 C), temperature source Oral, height _1  (1.676 m), weight 275 lb (124.7 kg), last menstrual period  08/13/2019, SpO2 99 %. Body mass index is 44.39 kg/m.  Indirect Calorimeter completed today shows a VO2 of 215 and a REE of 1496.  Her calculated basal metabolic rate is 1884 thus her basal metabolic rate is worse than expected.  General: Cooperative, alert, well developed, in no acute distress. HEENT: Conjunctivae and lids unremarkable. Cardiovascular: Regular rhythm.  Lungs: Normal work of breathing. Neurologic: No focal deficits.   Lab Results  Component Value Date   CREATININE 0.85 08/01/2019   BUN 10 08/01/2019   NA 138 08/01/2019   K 3.9 08/01/2019   CL 102 08/01/2019   CO2 32 08/01/2019   Lab Results  Component Value Date   ALT 11 08/01/2019   AST 15 08/01/2019   ALKPHOS 46 08/01/2019   BILITOT 0.3 08/01/2019   Lab Results  Component Value Date   HGBA1C 5.6 08/01/2019  Lab Results  Component Value Date   CHOL 223 (H) 08/01/2019   HDL 54.40 08/01/2019   LDLCALC 154 (H) 08/01/2019   TRIG 73.0 08/01/2019   CHOLHDL 4 08/01/2019   Lab Results  Component Value Date   WBC 5.6 08/01/2019   HGB 12.5 08/01/2019   HCT 37.3 08/01/2019   MCV 88.9 08/01/2019   PLT 318.0 08/01/2019   Attestation Statements:   This is the patient's first visit at [facility]. The patient's NEW PATIENT PACKET was reviewed at length. Included in the packet: current and past health history, medications, allergies, ROS, gynecologic history (women only), surgical history, family history, social history, weight history, weight loss surgery history (for those that have had weight loss surgery), nutritional evaluation, mood and food questionnaire, PHQ9, Epworth questionnaire, sleep habits questionnaire, patient life and health improvement goals questionnaire. These will all be scanned into the patient's chart under media.   During the visit, I independently reviewed the patient's EKG, bioimpedance scale results, and indirect calorimeter results. I used this information to tailor a  meal plan for the patient that will help her to lose weight and will improve her obesity-related conditions going forward. I performed a medically necessary appropriate examination and/or evaluation. I discussed the assessment and treatment plan with the patient. The patient was provided an opportunity to ask questions and all were answered. The patient agreed with the plan and demonstrated an understanding of the instructions. Labs were ordered at this visit and will be reviewed at the next visit unless more critical results need to be addressed immediately. Clinical information was updated and documented in the EMR.   I, Water quality scientist, CMA, am acting as Location manager for [employer], DO.  I have reviewed the above documentation for accuracy and completeness, and I agree with the above. Briscoe Deutscher, DO

## 2019-08-28 ENCOUNTER — Encounter (INDEPENDENT_AMBULATORY_CARE_PROVIDER_SITE_OTHER): Payer: Self-pay | Admitting: Family Medicine

## 2019-08-28 LAB — ANEMIA PANEL
Ferritin: 46 ng/mL (ref 15–150)
Folate, Hemolysate: 272 ng/mL
Folate, RBC: 690 ng/mL (ref >498)
Hematocrit: 39.4 % (ref 34.0–46.6)
Iron Saturation: 17 % (ref 15–55)
Iron: 44 ug/dL (ref 27–159)
Retic Ct Pct: 1.5 % (ref 0.6–2.6)
Total Iron Binding Capacity: 257 ug/dL (ref 250–450)
UIBC: 213 ug/dL (ref 131–425)
Vitamin B-12: 360 pg/mL (ref 232–1245)

## 2019-08-28 LAB — VITAMIN D 25 HYDROXY (VIT D DEFICIENCY, FRACTURES): Vit D, 25-Hydroxy: 28 ng/mL — ABNORMAL LOW (ref 30.0–100.0)

## 2019-08-28 LAB — T3: T3, Total: 143 ng/dL (ref 71–180)

## 2019-08-28 LAB — T4, FREE: Free T4: 1.38 ng/dL (ref 0.82–1.77)

## 2019-08-28 LAB — INSULIN, RANDOM: INSULIN: 13.3 u[IU]/mL (ref 2.6–24.9)

## 2019-08-28 LAB — TSH: TSH: 1.32 u[IU]/mL (ref 0.450–4.500)

## 2019-08-28 LAB — THYROID PEROXIDASE ANTIBODY: Thyroperoxidase Ab SerPl-aCnc: 78 IU/mL — ABNORMAL HIGH (ref 0–34)

## 2019-08-28 NOTE — Telephone Encounter (Signed)
Please advise thanks.

## 2019-08-29 ENCOUNTER — Other Ambulatory Visit: Payer: Self-pay

## 2019-08-29 ENCOUNTER — Other Ambulatory Visit: Payer: Self-pay | Admitting: Internal Medicine

## 2019-08-29 MED ORDER — AMLODIPINE BESYLATE 10 MG PO TABS: 10.0000 mg | ORAL_TABLET | Freq: Every day | ORAL | 3 refills | Status: DC

## 2019-08-29 MED ORDER — EPINEPHRINE 0.3 MG/0.3ML IJ SOAJ: 0.3000 mg | INTRAMUSCULAR | 3 refills | Status: AC | PRN

## 2019-08-29 MED FILL — AMLODIPINE BESYLATE 10 MG T: 10 | 90 days supply | Qty: 90 | Fill #0

## 2019-09-10 ENCOUNTER — Encounter (INDEPENDENT_AMBULATORY_CARE_PROVIDER_SITE_OTHER): Payer: Self-pay | Admitting: Family Medicine

## 2019-09-10 ENCOUNTER — Ambulatory Visit (INDEPENDENT_AMBULATORY_CARE_PROVIDER_SITE_OTHER): Payer: 59 | Admitting: Family Medicine

## 2019-09-10 ENCOUNTER — Other Ambulatory Visit: Payer: Self-pay

## 2019-09-10 VITALS — BP 107/73 | HR 98 | Temp 98.9°F | Ht 66.0 in | Wt 269.0 lb

## 2019-09-10 DIAGNOSIS — E7800 Pure hypercholesterolemia, unspecified: Secondary | ICD-10-CM | POA: Insufficient documentation

## 2019-09-10 DIAGNOSIS — E8881 Metabolic syndrome: Secondary | ICD-10-CM | POA: Diagnosis not present

## 2019-09-10 DIAGNOSIS — Z72820 Sleep deprivation: Secondary | ICD-10-CM | POA: Diagnosis not present

## 2019-09-10 DIAGNOSIS — I10 Essential (primary) hypertension: Secondary | ICD-10-CM

## 2019-09-10 DIAGNOSIS — Z6841 Body Mass Index (BMI) 40.0 and over, adult: Secondary | ICD-10-CM

## 2019-09-10 DIAGNOSIS — Z9189 Other specified personal risk factors, not elsewhere classified: Secondary | ICD-10-CM | POA: Diagnosis not present

## 2019-09-10 DIAGNOSIS — E66813 Obesity, class 3: Secondary | ICD-10-CM

## 2019-09-10 DIAGNOSIS — E7849 Other hyperlipidemia: Secondary | ICD-10-CM | POA: Diagnosis not present

## 2019-09-10 DIAGNOSIS — E079 Disorder of thyroid, unspecified: Secondary | ICD-10-CM | POA: Insufficient documentation

## 2019-09-10 DIAGNOSIS — E559 Vitamin D deficiency, unspecified: Secondary | ICD-10-CM | POA: Diagnosis not present

## 2019-09-10 DIAGNOSIS — E88819 Insulin resistance, unspecified: Secondary | ICD-10-CM

## 2019-09-10 DIAGNOSIS — E069 Thyroiditis, unspecified: Secondary | ICD-10-CM

## 2019-09-10 MED ORDER — VITAMIN D (ERGOCALCIFEROL) 1.25 MG (50000 UNIT) PO CAPS: 50000.0000 [IU] | ORAL_CAPSULE | ORAL | 0 refills | Status: AC

## 2019-09-10 MED FILL — VIT D2 1.25 MG (50,000 UNIT: 1.25 MG | 24 days supply | Qty: 8 | Fill #0

## 2019-09-10 NOTE — Progress Notes (Signed)
Chief Complaint:   OBESITY Misty Mathis is here to discuss her progress with her obesity treatment plan along with follow-up of her obesity related diagnoses. Misty Mathis is on the Category 1 Plan and states she is following her eating plan approximately 75% of the time. Misty Mathis states she is walking 2 miles 3-4 times per week.  Today's visit was #: 2 Starting weight: 275 lbs Starting date: 08/27/2019 Today's weight: 269 lbs Today's date: 09/10/2019 Total lbs lost to date: 6 lbs Total lbs lost since last in-office visit: 6 lbs  Interim History: Misty Mathis says she went to a family cookout on Sunday, and that was the only "cheating" she did since last visit.  She denies polyphagia and says she has been drinking more water.  She continues to have interrupted sleep.  She endorses mild constipation.  She will add Kodiac oatmeal to her regimen.  Subjective:   1. Vitamin D deficiency Misty Mathis's Vitamin D level was 28.0 on 08/27/2019. She is currently taking prescription vitamin D 50,000 IU each week. She denies nausea, vomiting or muscle weakness.  2. Insulin resistance Misty Mathis has a diagnosis of insulin resistance based on her elevated fasting insulin level >5. She continues to work on diet and exercise to decrease her risk of diabetes.  Lab Results  Component Value Date   INSULIN 13.3 08/27/2019   Lab Results  Component Value Date   HGBA1C 5.6 08/01/2019   3. Other hyperlipidemia Misty Mathis has hyperlipidemia and has been trying to improve her cholesterol levels with intensive lifestyle modification including a low saturated fat diet, exercise and weight loss. She denies any chest pain, claudication or myalgias.  Lab Results  Component Value Date   ALT 11 08/01/2019   AST 15 08/01/2019   ALKPHOS 46 08/01/2019   BILITOT 0.3 08/01/2019   Lab Results  Component Value Date   CHOL 223 (H) 08/01/2019   HDL 54.40 08/01/2019   LDLCALC 154 (H) 08/01/2019   TRIG 73.0 08/01/2019   CHOLHDL 4  08/01/2019   4. Thyroiditis Elevated TPO discussed. No goiter noted on exam.  Lab Results  Component Value Date   TSH 1.320 08/27/2019   FREET4 1.38 08/27/2019    Lab Results  Component Value Date   TSH 1.320 08/27/2019   FREET4 1.38 08/27/2019    5. Poor sleep She has an appointment with the sleep specialist on 09/23/2019.  6. Essential hypertension Misty Mathis's blood pressure is low at 107/73.  BP Readings from Last 3 Encounters:  09/10/19 107/73  08/27/19 115/77  08/01/19 (!) 152/100   7. At risk for complication associated with hypotension The patient is at a higher than average risk of hypotension due to weight loss.  Assessment/Plan:   1. Vitamin D deficiency Low Vitamin D level contributes to fatigue and are associated with obesity, breast, and colon cancer. She agrees to increase prescription Vitamin D @50 ,000 to every 3 day dosing and will follow-up for routine testing of Vitamin D, at least 2-3 times per year to avoid over-replacement.  2. Insulin resistance Misty Mathis will continue to work on weight loss, exercise, and decreasing simple carbohydrates to help decrease the risk of diabetes. Misty Mathis agreed to follow-up with Shanda Bumps as directed to closely monitor her progress.  3. Other hyperlipidemia Cardiovascular risk and specific lipid/LDL goals reviewed.  We discussed several lifestyle modifications today and Nazariah will continue to work on diet, exercise and weight loss efforts. Orders and follow up as documented in patient record.   Counseling Intensive lifestyle  modifications are the first line treatment for this issue. . Dietary changes: Increase soluble fiber. Decrease simple carbohydrates. . Exercise changes: Moderate to vigorous-intensity aerobic activity 150 minutes per week if tolerated. . Lipid-lowering medications: see documented in medical record.  4. Thyroiditis Stable labs. Will refer Misty Mathis to Endocrinology, as per below.  Orders - Ambulatory  referral to Endocrinology  5. Poor sleep Counseling: Intensive lifestyle modifications are the first line treatment for this issue. We discussed several lifestyle modifications today and she will continue to work on diet, exercise and weight loss efforts.   6. Essential hypertension Misty Mathis will cut her amlodipine dose in half due to low blood pressure.  She will now take 5 mg daily.  7. At risk for complication associated with hypotension Misty Mathis was given approximately 15 minutes of education and counseling today to help avoid hypotension. We discussed risks of hypotension with weight loss and signs of hypotension such as feeling lightheaded or unsteady.  Repetitive spaced learning was employed today to elicit superior memory formation and behavioral change.  8. Class 3 severe obesity with serious comorbidity and body mass index (BMI) of 40.0 to 44.9 in adult, unspecified obesity type (HCC) Misty Mathis is currently in the action stage of change. As such, her goal is to continue with weight loss efforts. She has agreed to the Category 1 Plan.   Exercise goals: For substantial health benefits, adults should do at least 150 minutes (2 hours and 30 minutes) a week of moderate-intensity, or 75 minutes (1 hour and 15 minutes) a week of vigorous-intensity aerobic physical activity, or an equivalent combination of moderate- and vigorous-intensity aerobic activity. Aerobic activity should be performed in episodes of at least 10 minutes, and preferably, it should be spread throughout the week.  Behavioral modification strategies: increasing lean protein intake, increasing water intake and increasing high fiber foods.  Misty Mathis has agreed to follow-up with our clinic in 2 weeks. She was informed of the importance of frequent follow-up visits to maximize her success with intensive lifestyle modifications for her multiple health conditions.   Objective:   Blood pressure 107/73, pulse 98, temperature 98.9 F  (37.2 C), temperature source Oral, height 5\' 6"  (1.676 m), weight 269 lb (122 kg), last menstrual period 08/13/2019, SpO2 100 %. Body mass index is 43.42 kg/m.  General: Cooperative, alert, well developed, in no acute distress. HEENT: Conjunctivae and lids unremarkable. Cardiovascular: Regular rhythm.  Lungs: Normal work of breathing. Neurologic: No focal deficits.   Lab Results  Component Value Date   CREATININE 0.85 08/01/2019   BUN 10 08/01/2019   NA 138 08/01/2019   K 3.9 08/01/2019   CL 102 08/01/2019   CO2 32 08/01/2019   Lab Results  Component Value Date   ALT 11 08/01/2019   AST 15 08/01/2019   ALKPHOS 46 08/01/2019   BILITOT 0.3 08/01/2019   Lab Results  Component Value Date   HGBA1C 5.6 08/01/2019   Lab Results  Component Value Date   INSULIN 13.3 08/27/2019   Lab Results  Component Value Date   TSH 1.320 08/27/2019   Lab Results  Component Value Date   CHOL 223 (H) 08/01/2019   HDL 54.40 08/01/2019   LDLCALC 154 (H) 08/01/2019   TRIG 73.0 08/01/2019   CHOLHDL 4 08/01/2019   Lab Results  Component Value Date   WBC 5.6 08/01/2019   HGB 12.5 08/01/2019   HCT 39.4 08/27/2019   MCV 88.9 08/01/2019   PLT 318.0 08/01/2019   Lab Results  Component Value Date   IRON 44 08/27/2019   TIBC 257 08/27/2019   FERRITIN 46 08/27/2019   Attestation Statements:   Reviewed by clinician on day of visit: allergies, medications, problem list, medical history, surgical history, family history, social history, and previous encounter notes.  I, Insurance claims handler, CMA, am acting as Energy manager for [employer], DO.  I have reviewed the above documentation for accuracy and completeness, and I agree with the above. Helane Rima, DO

## 2019-09-11 ENCOUNTER — Telehealth (INDEPENDENT_AMBULATORY_CARE_PROVIDER_SITE_OTHER): Payer: Self-pay | Admitting: Family Medicine

## 2019-09-11 DIAGNOSIS — E069 Thyroiditis, unspecified: Secondary | ICD-10-CM

## 2019-09-11 NOTE — Telephone Encounter (Signed)
Spoke with pt informed referral was sent and where it was sent. Pt given contact info of endocrinology office. Jeralene Peters, LPN

## 2019-09-11 NOTE — Telephone Encounter (Signed)
Patient called, she stated Dr. Earlene Plater was going to refer her to an Endocrinologist, she wants to know who she is sending her to and if the referral has been made.  [phone]

## 2019-09-12 NOTE — Telephone Encounter (Signed)
Spoke with pt referral sent Jeralene Peters, LPN

## 2019-09-12 NOTE — Telephone Encounter (Signed)
Another option is to get her into Dr. Ronnette Hila at [facility]. She is accepting patients within three days. EW

## 2019-09-18 ENCOUNTER — Other Ambulatory Visit: Payer: Self-pay

## 2019-09-18 ENCOUNTER — Other Ambulatory Visit: Payer: Self-pay | Admitting: Family Medicine

## 2019-09-18 ENCOUNTER — Ambulatory Visit: Payer: 59 | Admitting: Internal Medicine

## 2019-09-23 ENCOUNTER — Encounter: Payer: Self-pay | Admitting: Neurology

## 2019-09-23 ENCOUNTER — Ambulatory Visit (INDEPENDENT_AMBULATORY_CARE_PROVIDER_SITE_OTHER): Payer: 59 | Admitting: Neurology

## 2019-09-23 VITALS — BP 130/88 | HR 71 | Ht 66.0 in | Wt 278.0 lb

## 2019-09-23 DIAGNOSIS — M2242 Chondromalacia patellae, left knee: Secondary | ICD-10-CM | POA: Diagnosis not present

## 2019-09-23 DIAGNOSIS — R030 Elevated blood-pressure reading, without diagnosis of hypertension: Secondary | ICD-10-CM | POA: Diagnosis not present

## 2019-09-23 DIAGNOSIS — R0683 Snoring: Secondary | ICD-10-CM

## 2019-09-23 DIAGNOSIS — R519 Headache, unspecified: Secondary | ICD-10-CM

## 2019-09-23 DIAGNOSIS — G478 Other sleep disorders: Secondary | ICD-10-CM

## 2019-09-23 NOTE — Patient Instructions (Signed)
Obesity Hypoventilation Syndrome  Obesity hypoventilation syndrome (OHS) means that you are not breathing well enough to get air in and out of your lungs efficiently (ventilation). This causes a low oxygen level and a high carbon dioxide level in your blood (hypoventilation). Having too much total body fat (obesity) is a significant risk factor for developing OHS. OHS makes it harder for your heart to pump oxygen-rich blood to your body. It can cause sleep disturbances and make you feel sleepy during the day. Over time, OHS can increase your risk for:  Heart disease.  High blood pressure (hypertension).  Reduced ability to absorb sugar from the bloodstream (insulin resistance).  Heart failure. Over time, OHS weakens your heart and can lead to heart failure. What are the causes? The exact cause of OHS is not known. Possible causes include:  Pressure on the lungs from excess body weight.  Obesity-related changes in how much air the lungs can hold (lung capacity) and how much they can expand (lung compliance).  Failure of the brain to regulate oxygen and carbon dioxide levels properly.  Chemicals (hormones) produced by excess fat cells interfering with breathing regulation.  A breathing condition in which breathing pauses or becomes shallow during sleep (sleep apnea). This condition can eventually cause the body to ventilate poorly and to hold onto carbon dioxide during the day. What increases the risk? You may have a greater risk for OHS if you:  Have a BMI of 30 or higher. BMI is an estimate of body fat that is calculated from height and weight. For adults, a BMI of 30 or higher is considered obese.  Are 40?36 years old.  Carry most of your excess weight around your waist.  Experience moderate symptoms of sleep apnea. What are the signs or symptoms? The most common symptoms of OHS are:  Daytime sleepiness.  Lack of energy.  Shortness of breath.  Morning headaches.  Sleep  apnea.  Trouble concentrating.  Irritability, mood swings, or depression.  Swollen veins in the neck.  Swelling of the legs. How is this diagnosed? Your health care provider may suspect OHS if you are obese and have poor breathing during the day and at night. Your health care provider will also do a physical exam. You may have tests to:  Measure your BMI.  Measure your blood oxygen level with a sensor placed on your finger (pulse oximetry).  Measure blood oxygen and carbon dioxide in a blood sample.  Measure the amount of red blood cells in a blood sample. OHS causes the number of red blood cells you have to increase (polycythemia).  Check your breathing ability (pulmonary function testing).  Check your breathing ability, breathing patterns, and oxygen level while you sleep (sleep study). You may also have a chest X-ray to rule out other breathing problems. You may have an electrocardiogram (ECG) and or echocardiogram to check for signs of heart failure. How is this treated? Weight loss is the most important part of treatment for OHS, and it may be the only treatment that you need. Other treatments may include:  Using a device to open your airway while you sleep, such as a continuous positive airway pressure (CPAP) machine that delivers oxygen to your airway through a mask.  Surgery (gastric bypass surgery) to lower your BMI. This may be needed if: ? You are very obese. ? Other treatments have not worked for you. ? Your OHS is very severe and is causing organ damage, such as heart failure. Follow these   instructions at home:  Medicines  Take over-the-counter and prescription medicines only as told by your health care provider.  Ask your health care provider what medicines are safe for you. You may be told to avoid medicines that can impair breathing and make OHS worse, such as sedatives and narcotics. Sleeping habits  If you are prescribed a CPAP machine, make sure you  understand and use the machine as directed.  Try to get 8 hours of sleep every night.  Go to bed at the same time every night, and get up at the same time every day. General instructions  Work with your health care provider to make a diet and exercise plan that helps you reach and maintain a healthy weight.  Eat a healthy diet.  Avoid smoking.  Exercise regularly as told by your health care provider.  During the evening, do not drink caffeine and do not eat heavy meals.  Keep all follow-up visits as told by your health care provider. This is important. Contact a health care provider if:  You experience new or worsening shortness of breath.  You have chest pain.  You have an irregular heartbeat (palpitations).  You have dizziness.  You faint.  You develop a cough.  You have a fever.  You have chest pain when you breathe (pleurisy). This information is not intended to replace advice given to you by your health care provider. Make sure you discuss any questions you have with your health care provider. Document Revised: 07/20/2018 Document Reviewed: 09/07/2015 Elsevier Patient Education  2020 Elsevier Inc. Quality Sleep Information, Adult Quality sleep is important for your mental and physical health. It also improves your quality of life. Quality sleep means you:  Are asleep for most of the time you are in bed.  Fall asleep within 30 minutes.  Wake up no more than once a night.  Are awake for no longer than 20 minutes if you do wake up during the night. Most adults need 7-8 hours of quality sleep each night. How can poor sleep affect me? If you do not get enough quality sleep, you may have:  Mood swings.  Daytime sleepiness.  Confusion.  Decreased reaction time.  Sleep disorders, such as insomnia and sleep apnea.  Difficulty with: ? Solving problems. ? Coping with stress. ? Paying attention. These issues may affect your performance and productivity at  work, school, and at home. Lack of sleep may also put you at higher risk for accidents, suicide, and risky behaviors. If you do not get quality sleep you may also be at higher risk for several health problems, including:  Infections.  Type 2 diabetes.  Heart disease.  High blood pressure.  Obesity.  Worsening of long-term conditions, like arthritis, kidney disease, depression, Parkinson's disease, and epilepsy. What actions can I take to get more quality sleep?      Stick to a sleep schedule. Go to sleep and wake up at about the same time each day. Do not try to sleep less on weekdays and make up for lost sleep on weekends. This does not work.  Try to get about 30 minutes of exercise on most days. Do not exercise 2-3 hours before going to bed.  Limit naps during the day to 30 minutes or less.  Do not use any products that contain nicotine or tobacco, such as cigarettes or e-cigarettes. If you need help quitting, ask your health care provider.  Do not drink caffeinated beverages for at least 8 hours before   going to bed. Coffee, tea, and some sodas contain caffeine.  Do not drink alcohol close to bedtime.  Do not eat large meals close to bedtime.  Do not take naps in the late afternoon.  Try to get at least 30 minutes of sunlight every day. Morning sunlight is best.  Make time to relax before bed. Reading, listening to music, or taking a hot bath promotes quality sleep.  Make your bedroom a place that promotes quality sleep. Keep your bedroom dark, quiet, and at a comfortable room temperature. Make sure your bed is comfortable. Take out sleep distractions like TV, a computer, smartphone, and bright lights.  If you are lying awake in bed for longer than 20 minutes, get up and do a relaxing activity until you feel sleepy.  Work with your health care provider to treat medical conditions that may affect sleeping, such as: ? Nasal obstruction. ? Snoring. ? Sleep apnea and other  sleep disorders.  Talk to your health care provider if you think any of your prescription medicines may cause you to have difficulty falling or staying asleep.  If you have sleep problems, talk with a sleep consultant. If you think you have a sleep disorder, talk with your health care provider about getting evaluated by a specialist. Where to find more information  National Sleep Foundation website: https://sleepfoundation.org  National Heart, Lung, and Blood Institute (NHLBI): www.nhlbi.nih.gov/files/docs/public/sleep/healthy_sleep.pdf  Centers for Disease Control and Prevention (CDC): www.cdc.gov/sleep/index.html Contact a health care provider if you:  Have trouble getting to sleep or staying asleep.  Often wake up very early in the morning and cannot get back to sleep.  Have daytime sleepiness.  Have daytime sleep attacks of suddenly falling asleep and sudden muscle weakness (narcolepsy).  Have a tingling sensation in your legs with a strong urge to move your legs (restless legs syndrome).  Stop breathing briefly during sleep (sleep apnea).  Think you have a sleep disorder or are taking a medicine that is affecting your quality of sleep. Summary  Most adults need 7-8 hours of quality sleep each night.  Getting enough quality sleep is an important part of health and well-being.  Make your bedroom a place that promotes quality sleep and avoid things that may cause you to have poor sleep, such as alcohol, caffeine, smoking, and large meals.  Talk to your health care provider if you have trouble falling asleep or staying asleep. This information is not intended to replace advice given to you by your health care provider. Make sure you discuss any questions you have with your health care provider. Document Revised: 07/05/2017 Document Reviewed: 07/05/2017 Elsevier Patient Education  2020 Elsevier Inc.  

## 2019-09-23 NOTE — Progress Notes (Signed)
SLEEP MEDICINE CLINIC    Provider:  Melvyn Novas, MD  Primary Care Physician:  Myrlene Broker, MD [address] [place] [address]     Referring Provider:  Helane Rima , DO         Chief Complaint according to patient   Patient presents with:    . New Patient (Initial Visit)     Here from [facility] for a sleep evaluation.      HISTORY OF PRESENT ILLNESS:  Misty Mathis is a 34  year old African American female [employer] employee, and is  seen here upon a referral on 09/23/2019 from Dr Earlene Plater.  Chief concern according to patient : " I had an ENT doctor send me a HST sleep study but no data resulted"   I have the pleasure of seeing Misty Mathis today, a right -handed  African American female with a possible sleep disorder.  She   has a past medical history of Abnormal Pap smear (07), Anemia, Anxiety, Back pain, Edema, lower extremity, Hypertension, Hypothyroid, Joint pain, morbid Obesity, Palpitation, Prediabetes, Shortness of breath and Vitamin D deficiency..    Sleep relevant medical history:  Neck surgery , cyst removal.    Family medical /sleep history: No family member on CPAP with OSA, no insomnia, no sleep walkers.    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   The patient currently works daytime . Pets are not present. Tobacco use: none .  ETOH use ; none , Caffeine intake in form of Coffee( none) Soda( 1 can a day) Tea ( 1 glass or less ) or energy drinks. Regular exercise not yet.  Walking 2 miles 3 times a week.   Hobbies : arts and craft.       Sleep habits are as follows: The patient's dinner time is between 6-7  PM. The patient goes to bed at 10.30 PM and  Struggles 2-3 times a week with difficulties to fall asleep. She continues to sleep for 3-4 hours, wakes from external stimuli.    The preferred sleep position is right  sided, with the support of 2 pillows. Dreams are reportedly infrequent.  6.15 AM is the usual rise time.  The patient wakes up spontaneously at 8.30 -or with an alarm on week days.  She reports not feeling refreshed or restored in AM, with symptoms such as dry mouth , morning headaches , and residual fatigue. Naps are taken infrequently, lasting from 1-2 hours  -less refreshing than nocturnal sleep.    Review of Systems: Out of a complete 14 system review, the patient complains of only the following symptoms, and all other reviewed systems are negative.:  Fatigue, sleepiness , no nocturia, no RLS, snoring, fragmented sleep, Insomnia some days.  Anemia before ablation was performed.  Palpitations and SOB when physical active. Soreness of muscles. No numbness.    How likely are you to doze in the following situations: 0 = not likely, 1 = slight chance, 2 = moderate chance, 3 = high chance   Sitting and Reading? Watching Television? Sitting inactive in a public place (theater or meeting)? As a passenger in a car for an hour without a break? Lying down in the afternoon when circumstances permit? Sitting and talking to someone? Sitting quietly after lunch without alcohol? In a car, while stopped for a few minutes in  traffic?   Total = 7/ 24 points   FSS endorsed at 48 / 63 points.   Social History   Socioeconomic History  . Marital status: Married    Spouse name: Not on file  . Number of children: 3  . Years of education: Not on file  . Highest education level: Not on file  Occupational History  . Occupation: Art therapist  Tobacco Use  . Smoking status: Never Smoker  . Smokeless tobacco: Never Used  Substance and Sexual Activity  . Alcohol use: No  . Drug use: No  . Sexual activity: Yes    Partners: Male    Birth control/protection: None  Other Topics Concern  . Not on file  Social History Narrative  . Not on file   Social Determinants of Health   Financial  Resource Strain:   . Difficulty of Paying Living Expenses:   Food Insecurity:   . Worried About Charity fundraiser in the Last Year:   . Arboriculturist in the Last Year:   Transportation Needs:   . Film/video editor (Medical):   Marland Kitchen Lack of Transportation (Non-Medical):   Physical Activity:   . Days of Exercise per Week:   . Minutes of Exercise per Session:   Stress:   . Feeling of Stress :   Social Connections:   . Frequency of Communication with Friends and Family:   . Frequency of Social Gatherings with Friends and Family:   . Attends Religious Services:   . Active Member of Clubs or Organizations:   . Attends Archivist Meetings:   Marland Kitchen Marital Status:     Family History  Problem Relation Age of Onset  . Breast cancer Mother 62  . Hyperlipidemia Mother   . Colon cancer Maternal Aunt 34  . Lung cancer Maternal Grandfather   . Diabetes Maternal Grandmother   . Hypertension Brother   . Heart attack Neg Hx     Past Medical History:  Diagnosis Date  . Abnormal Pap smear 07   colposcopy with biopsy  . Anemia   . Anxiety   . Back pain   . Edema, lower extremity   . Herpes simplex   . Hypertension   . Hypothyroid   . Joint pain   . Obesity   . Palpitation   . Prediabetes   . Shortness of breath   . Sleep apnea   . Vitamin D deficiency     Past Surgical History:  Procedure Laterality Date  . ANKLE SURGERY  02/2009  . BREAST CYST EXCISION  2016  . PILONIDAL CYST EXCISION    . TUBAL LIGATION  01/14/2011   Procedure: POST PARTUM TUBAL LIGATION;  Surgeon: Paulo Fruit;  Location: [facility];  Service: Gynecology;  Laterality: Bilateral;  . uterine ablation  2017     Current Outpatient Medications on File Prior to Visit  Medication Sig Dispense Refill  . amLODipine (NORVASC) 10 MG tablet Take 1 tablet (10 mg total) by mouth daily. 90 tablet 3  . Butalbital-APAP-Caffeine 50-325-40 MG capsule Take 1 capsule by mouth every 6 (six) hours as needed for pain.     . cetirizine (ZYRTEC) 10 MG tablet Take 10 mg by mouth daily as needed.    . Diclofenac Sodium (PENNSAID) 2 % SOLN Pennsaid 20 mg/gram/actuation (2 %) topical soln in metered-dose pump  Apply 2 (two) pumps topically to affected area(s) twice a day    . EPINEPHrine 0.3 mg/0.3 mL IJ  SOAJ injection Inject 0.3 mLs (0.3 mg total) into the muscle as needed for anaphylaxis. 1 each 3  . Vitamin D, Ergocalciferol, (DRISDOL) 1.25 MG (50000 UNIT) CAPS capsule Take 1 capsule (50,000 Units total) by mouth every 3 (three) days. 8 capsule 0  . Vitamin D, Ergocalciferol, (DRISDOL) 1.25 MG (50000 UNIT) CAPS capsule TAKE 1 CAPSULE BY MOUTH EVERY 7 DAYS 12 capsule 0   No current facility-administered medications on file prior to visit.    Allergies  Allergen Reactions  . Peanut Oil Other (See Comments)    Had testing but never had a real reaction to peanuts.  . Phentermine Other (See Comments)    Tachycardia    Physical exam:  There were no vitals filed for this visit. There is no height or weight on file to calculate BMI.   Wt Readings from Last 3 Encounters:  09/10/19 269 lb (122 kg)  08/27/19 275 lb (124.7 kg)  08/01/19 275 lb (124.7 kg)     Ht Readings from Last 3 Encounters:  09/10/19 5\' 6"  (1.676 m)  08/27/19 5\' 6"  (1.676 m)  08/01/19 5\' 5"  (1.651 m)      General: The patient is awake, alert and appears not in acute distress. The patient is well groomed. Head: Normocephalic, atraumatic. Neck is supple. Mallampati 2,  neck circumference: 16.5  inches . Nasal airflow  patent.  Retrognathia is seen.  Dental status: intact  Cardiovascular:  Regular rate and cardiac rhythm by pulse,  without distended neck veins. Respiratory: Lungs are clear to auscultation.  Skin:  Without evidence of ankle edema, or rash. Trunk: The patient's posture is erect.   Neurologic exam : The patient is awake and alert, oriented to place and time.   Memory subjective described as intact.  Attention span &  concentration ability appears normal.  Speech is fluent,  without  dysarthria, dysphonia or aphasia.  Mood and affect are appropriate.   Cranial nerves: no loss of smell or taste reported. Pupils are equal and briskly reactive to light. Funduscopic exam deferred.   Extraocular movements in vertical and horizontal planes were intact and without nystagmus. No Diplopia. Visual fields by finger perimetry are intact. Hearing was intact to soft voice and finger rubbing.    Facial sensation intact to fine touch.  Facial motor strength is symmetric and tongue and uvula move midline.  Neck ROM : rotation, tilt and flexion extension were normal for age and shoulder shrug was symmetrical.    Motor exam:  Symmetric bulk, tone and ROM.   Normal tone without cog wheeling, symmetric grip strength .   Sensory:  Fine touch, pinprick and vibration were tested  and  normal.  Proprioception tested in the upper extremities was normal.   Coordination: Rapid alternating movements in the fingers/hands were of normal speed.  The Finger-to-nose maneuver was intact without evidence of ataxia, dysmetria or tremor.   Gait and station: Patient could rise unassisted from a seated position, walked without assistive device.  Stance is of normal width/ base and the patient turned with 3 steps.  Toe and heel walk were deferred.  Deep tendon reflexes: in the  upper and lower extremities are symmetric and intact.  Babinski response was deferred .     I have the pleasure of seeing Mrs. Delage here on 23 September 2019, upon referral by Dr. 08/03/19, geriatric medicine. The patient's current BMI is 44.8 on our scale but she was just last months measured at 44.39. 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 She is usually not a late winter note neither does she go to bed very late she does not describe indications  that would lead to the diagnosis of depression, but she does feel fatigued and her sleep is not longer as refreshing and restorative as it once was. She also describes that while she may be a snorer she has had a history of restless legs in the past currently she is not bothered by it yet it is hard for her to 3 times a week to initiate sleep in the first place. She is treated for hypertension she noted shortness of breath on exertion but she cannot sleep with 1 or 2 pillows and does not need an elevated head of bed. There is no mentioning of GERD or acid reflux disease, she does have osteoarthritis and she is known to snore. Hemoglobin A 1C was 5.6 her HDL was 54 and her cholesterol was 234.  there was a VO2 breather study done her indirect collar colorimeter shows a MVO2 of 215 and an EER of 1496 her calculated metabolic rate is 2947 and thus her metabolic rate is below the expected for her age and gender gender. She has excellent liver and kidney function and her LDL is actually elevated which is her good cholesterol. At this time we will looking if the quality of her sleep is negatively impacted by apnea or shallow breathing she also reports morning headaches and that naps actually make her feel worse.   After spending a total time of  35  minutes face to face and additional time for physical and neurologic examination, review of laboratory studies,  personal review of imaging studies, reports and results of other testing and review of referral information / records as far as provided in visit, I have established the following assessments:  1) the patient is apprehensive about a HST as one in the past had failed, I assured her of newer devices and technologies .  2) I will order a PSG also, but defer to insurance approval.    I would like to thank  Myrlene Broker, Md [address] [place],  [place] [address] for allowing me to meet with and to take care of this pleasant patient.    I plan  to follow up either personally or through our NP within 2-3  month.   CC: I will share my notes with PCP.  Electronically signed by: Melvyn Novas, MD 09/23/2019 3:06 PM  [facility] Board certified by The [employer] of Sleep Medicine and Diplomate of the [employer] of Sleep Medicine. Board certified In Neurology through the ABPN, Fellow of the [employer] of Neurology. Medical Director of [facility].

## 2019-09-24 ENCOUNTER — Ambulatory Visit (INDEPENDENT_AMBULATORY_CARE_PROVIDER_SITE_OTHER): Payer: 59 | Admitting: Adult Health

## 2019-09-25 ENCOUNTER — Ambulatory Visit: Payer: 59 | Admitting: Internal Medicine

## 2019-09-25 ENCOUNTER — Other Ambulatory Visit: Payer: Self-pay

## 2019-09-25 ENCOUNTER — Encounter: Payer: Self-pay | Admitting: Internal Medicine

## 2019-09-25 VITALS — BP 124/86 | HR 82 | Ht 66.0 in | Wt 278.4 lb

## 2019-09-25 DIAGNOSIS — E063 Autoimmune thyroiditis: Secondary | ICD-10-CM

## 2019-09-25 NOTE — Patient Instructions (Signed)
Hashimoto's Disease is an autoimmune - mediated destruction of the thyroid gland. The usual course of Hashimoto's thyroiditis is the gradual loss of thyroid function. Overt hypothyroidism occurs at a rate of ~ 5% per year.   I would recommend annual thyroid function tests through your primary care Provider's office or sooner if symptoms consistent with underactive thyroid    I have ordered a thyroid ultrasound    At this time, your thyroid is normal and no there's no indication to treat

## 2019-09-25 NOTE — Progress Notes (Signed)
Name: Misty Mathis  MRN/ DOB: [id number], Dec 27, 1985    Age/ Sex: 34 y.o., female    PCP: Hoyt Koch, MD   Reason for Endocrinology Evaluation: Hashimoto's Disease      Date of Initial Endocrinology Evaluation: 09/25/2019     HPI: Misty Mathis is a 33 y.o. female with a past medical history of obesity The patient presented for initial endocrinology clinic visit on 09/25/2019 for consultative assistance with her Hashimoto's disease   Pt was  noted to have elevated Anti-TPO ab's during evaluation of obesity through the [facility].    She was on LT-4 replacement at the age of 63 , has been off since 2006.    Of note the pt has normal TSH, FT4 and T3.    She has noted weight loss initially but has gained the weight right back.   Has noted local neck enlargement , with odynophagia and dysphagia  ~ 2 weeks ago. Symptoms have been stable .  She endorses fatigue and snoring, was evaluated by neurology  for OSA. Pt is pending further evaluation   Denies depression but has anxiety  She  does not have regular bowel movements.  No biotin use No prior exposure to radiation     No Fh of thyroid disease   HISTORY:  Past Medical History:  Past Medical History:  Diagnosis Date  . Abnormal Pap smear 07   colposcopy with biopsy  . Anemia   . Anxiety   . Back pain   . Edema, lower extremity   . Herpes simplex   . Hypertension   . Hypothyroid   . Joint pain   . Obesity   . Palpitation   . Prediabetes   . Shortness of breath   . Sleep apnea   . Vitamin D deficiency    Past Surgical History:  Past Surgical History:  Procedure Laterality Date  . ANKLE SURGERY  02/2009  . BREAST CYST EXCISION  2016  . PILONIDAL CYST EXCISION    . TUBAL LIGATION  01/14/2011   Procedure: POST PARTUM TUBAL LIGATION;  Surgeon: Paulo Fruit;  Location: [facility];  Service: Gynecology;  Laterality: Bilateral;  . uterine ablation  2017        Social History:  reports that she has never smoked. She has never used smokeless tobacco. She reports that she does not drink alcohol and does not use drugs.  Family History: family history includes Breast cancer (age of onset: 41) in her mother; Colon cancer (age of onset: 33) in her maternal aunt; Diabetes in her maternal grandmother; Hyperlipidemia in her mother; Hypertension in her brother; Lung cancer in her maternal grandfather.   HOME MEDICATIONS: Allergies as of 09/25/2019      Reactions   Peanut Oil Other (See Comments)   Had testing but never had a real reaction to peanuts.   Phentermine Other (See Comments)   Tachycardia      Medication List       Accurate as of September 25, 2019  2:55 PM. If you have any questions, ask your nurse or doctor.        Butalbital-APAP-Caffeine 50-325-40 MG capsule Take 1 capsule by mouth every 6 (six) hours as needed for pain.   cetirizine 10 MG tablet Commonly known as: ZYRTEC Take 10 mg by mouth daily as needed.   EPINEPHrine 0.3 mg/0.3 mL Soaj injection Commonly known as: EPI-PEN Inject 0.3 mLs (0.3 mg total) into  the muscle as needed for anaphylaxis.   Pennsaid 2 % Soln Generic drug: Diclofenac Sodium Pennsaid 20 mg/gram/actuation (2 %) topical soln in metered-dose pump  Apply 2 (two) pumps topically to affected area(s) twice a day   Vitamin D (Ergocalciferol) 1.25 MG (50000 UNIT) Caps capsule Commonly known as: DRISDOL Take 1 capsule (50,000 Units total) by mouth every 3 (three) days.         REVIEW OF SYSTEMS:  Results for Misty Mathis, Misty Mathis (MRN [id number]) as of 09/25/2019 14:55  Ref. Range 08/27/2019 11:57  TSH Latest Ref Range: 0.450 - 4.500 uIU/mL 1.320  Triiodothyronine (T3) Latest Ref Range: 71 - 180 ng/dL 644  I3,KVQQ(VZDGLO) Latest Ref Range: 0.82 - 1.77 ng/dL 7.56  Thyroperoxidase Ab SerPl-aCnc Latest Ref Range: 0 - 34 IU/mL 78 (H)     OBJECTIVE:  VS: BP 124/86 (BP Location: Right Arm, Patient  Position: Sitting, Cuff Size: Large)   Pulse 82   Ht 5\' 6"  (1.676 m)   Wt 278 lb 6.4 oz (126.3 kg)   LMP 09/11/2019 (Exact Date)   SpO2 96%   BMI 44.93 kg/m    Wt Readings from Last 3 Encounters:  09/25/19 278 lb 6.4 oz (126.3 kg)  09/23/19 278 lb (126.1 kg)  09/10/19 269 lb (122 kg)     EXAM: General: Pt appears well and is in NAD  Neck: General: Supple without adenopathy. Thyroid: Thyroid gland is asymmetrical, R> L.   No nodules appreciated.  Lungs: Clear with good BS bilat with no rales, rhonchi, or wheezes  Heart: Auscultation: RRR.  Abdomen: Normoactive bowel sounds, soft, nontender, without masses or organomegaly palpable  Extremities:  BL LE: No pretibial edema normal ROM and strength.  Skin: Hair: Texture and amount normal with gender appropriate distribution Skin Inspection: No rashes  Neuro: Cranial nerves: II - XII grossly intact  Motor: Normal strength throughout DTRs: 2+ and symmetric in UE without delay in relaxation phase  Mental Status: Judgment, insight: Intact Orientation: Oriented to time, place, and person Mood and affect: No depression, anxiety, or agitation     DATA REVIEWED: Results for Misty Mathis, Misty Mathis (MRN Vanessa Womelsdorf) as of 09/25/2019 14:55  Ref. Range 08/27/2019 11:57  TSH Latest Ref Range: 0.450 - 4.500 uIU/mL 1.320  Triiodothyronine (T3) Latest Ref Range: 71 - 180 ng/dL 08/29/2019  416) Latest Ref Range: 0.82 - 1.77 ng/dL S0,YTKZ(SWFUXN  Thyroperoxidase Ab SerPl-aCnc Latest Ref Range: 0 - 34 IU/mL 78 (H)      ASSESSMENT/PLAN/RECOMMENDATIONS:   1. Hashimoto's Thyroid disease:  - Pt with multiple non-specific symptoms that are not attributed to her thyroid at this time, as her thyroid function is normal.  - I explained to the patient that Hashimoto's Disease is an autoimmune - mediated destruction of the thyroid gland. The usual course of Hashimoto's thyroiditis is the gradual loss of thyroid function. Overt hypothyroidism occurs at a rate  of ~ 5% per year.  - I am going to proceed with thyroid ultrasound due to asymmetry on exam.     F/U - pending ultrasound results. Otherwise, pt will need annual thyroid function tests that could be done through PCP's office or sooner if new symptoms.     Signed electronically by: 2.35, MD  [facility]  [facility] [address] [place] [place], [place] Phone: [phone] FAX: [phone]   CC: [phone], MD [address] [place] Phone: [phone] Fax: [phone]   Return to Endocrinology clinic  as below: Future Appointments  Date Time Provider Department Center  10/02/2019  7:30 AM Julaine Fusi, NP [facility] None  11/01/2019  8:00 AM Myrlene Broker, MD [facility] None

## 2019-09-27 ENCOUNTER — Other Ambulatory Visit: Payer: Self-pay

## 2019-09-27 ENCOUNTER — Ambulatory Visit (HOSPITAL_BASED_OUTPATIENT_CLINIC_OR_DEPARTMENT_OTHER)
Admission: RE | Admit: 2019-09-27 | Discharge: 2019-09-27 | Disposition: A | Discharge status: Home / Self Care | Payer: 59 | Source: Ambulatory Visit | Attending: Internal Medicine | Admitting: Internal Medicine

## 2019-09-27 DIAGNOSIS — E010 Iodine-deficiency related diffuse (endemic) goiter: Secondary | ICD-10-CM | POA: Diagnosis not present

## 2019-09-27 DIAGNOSIS — E063 Autoimmune thyroiditis: Secondary | ICD-10-CM | POA: Diagnosis not present

## 2019-10-01 ENCOUNTER — Ambulatory Visit (INDEPENDENT_AMBULATORY_CARE_PROVIDER_SITE_OTHER): Payer: 59 | Admitting: Internal Medicine

## 2019-10-01 ENCOUNTER — Other Ambulatory Visit: Payer: Self-pay

## 2019-10-01 ENCOUNTER — Encounter: Payer: Self-pay | Admitting: Internal Medicine

## 2019-10-01 VITALS — BP 140/100 | HR 77

## 2019-10-01 DIAGNOSIS — T7840XA Allergy, unspecified, initial encounter: Secondary | ICD-10-CM | POA: Insufficient documentation

## 2019-10-01 MED ORDER — METHYLPREDNISOLONE ACETATE 40 MG/ML IJ SUSP
40.0000 mg | Freq: Once | INTRAMUSCULAR | Status: AC
  Administered 2019-10-01: 40 mg via INTRAMUSCULAR

## 2019-10-01 NOTE — Patient Instructions (Signed)
We have given you the steroid shot today.   Take the zyrtec 3 times a day for the next 2 days to help avoid the allergic reaction and you can take benadryl as needed for any itching or hives.

## 2019-10-01 NOTE — Assessment & Plan Note (Signed)
Epi-pen administered during visit. She has spare at home. Depo-medrol 40 mg IM given at visit. Advised to take zyrtec TID for next few days. Benadryl as needed. No signs of anaphylaxis during exam but signs of allergic reaction present.

## 2019-10-01 NOTE — Progress Notes (Signed)
Subjective:   Patient ID: Misty Mathis, female    DOB: 04-Jun-1985, 34 y.o.   MRN: [id number]  HPI The patient is a 34 YO female coming in for accidental nut ingestion. She did eat egg rolls at lunch which she found out later had been fried in soybean oil. 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 Has eaten food at that restaurant without difficulty but never previously the egg rolls. She did start to have reaction about 1-1.5 hours after eating egg roll. Got lip tingling and some itching on the scalp and arms. Denies throat swelling, SOB. Anxiety at the reaction. She was assessed urgently and epi-pen she had on her person was administered by her with assistance by our staff. She was monitored constantly and reassessed about 30 minutes later with resolution of the lip tingling. There was mild facial swelling around the cheeks and nose. No lip or tongue swelling. No throat swelling. No fevers or chills. Itching is fading from the scalp and arms. No hives or rash on arms or scalp. She did also take a claritin 10 mg tablet prior to epi-pen administration. Takes zyrtec daily.   Review of Systems  Constitutional: Negative.   HENT: Positive for facial swelling. Negative for sore throat, tinnitus, trouble swallowing and voice change.        Lip tingling  Eyes: Negative.   Respiratory: Negative for cough, chest tightness and shortness of breath.   Cardiovascular: Negative for chest pain, palpitations and leg swelling.  Gastrointestinal: Negative for abdominal distention, abdominal pain, constipation, diarrhea, nausea and vomiting.  Musculoskeletal: Negative.   Skin: Negative.   Neurological: Negative.   Psychiatric/Behavioral: Negative.     Objective:  Physical Exam Constitutional:      Appearance: She is well-developed.     Comments: Initially mild distress emotionally, later calmed down  HENT:     Head: Normocephalic and atraumatic.     Comments: Mild cheek  swelling right with nose swelling, lips are not swollen, tongue not swollen, oropharynx without redness or swelling.  Cardiovascular:     Rate and Rhythm: Normal rate and regular rhythm.  Pulmonary:     Effort: Pulmonary effort is normal. No respiratory distress.     Breath sounds: Normal breath sounds. No wheezing or rales.     Comments: No distress with breathing or wheezing on exam Abdominal:     General: Bowel sounds are normal. There is no distension.     Palpations: Abdomen is soft.     Tenderness: There is no abdominal tenderness. There is no rebound.  Musculoskeletal:     Cervical back: Normal range of motion.  Skin:    General: Skin is warm and dry.  Neurological:     Mental Status: She is alert and oriented to person, place, and time.     Coordination: Coordination normal.     Vitals:   10/01/19 1425  BP: (!) 140/100  Pulse: 77  SpO2: 99%   Visit time 25 minutes in face to face communication with patient and coordination of care, additional 5 minutes spent in record review, coordination or care, ordering tests, communicating/referring to other healthcare professionals, documenting in medical records all on the same day of the visit for total time 30 minutes spent on the visit.   This visit occurred during the SARS-CoV-2 public health emergency.  Safety protocols were in place, including screening questions prior to the visit, additional usage of staff PPE, and extensive cleaning  of exam room while observing appropriate contact time as indicated for disinfecting solutions.   Assessment & Plan:  Depo-medrol 40 mg IM given at visit  Epi-pen administered by patient with supervision at our office.   Claritin 10 mg oral personal supply taken during visit as well

## 2019-10-02 ENCOUNTER — Ambulatory Visit (INDEPENDENT_AMBULATORY_CARE_PROVIDER_SITE_OTHER): Payer: 59 | Admitting: Adult Health

## 2019-10-03 ENCOUNTER — Encounter: Payer: Self-pay | Admitting: Neurology

## 2019-10-06 ENCOUNTER — Ambulatory Visit (INDEPENDENT_AMBULATORY_CARE_PROVIDER_SITE_OTHER): Payer: 59 | Admitting: Neurology

## 2019-10-06 DIAGNOSIS — M2242 Chondromalacia patellae, left knee: Secondary | ICD-10-CM

## 2019-10-06 DIAGNOSIS — R030 Elevated blood-pressure reading, without diagnosis of hypertension: Secondary | ICD-10-CM

## 2019-10-06 DIAGNOSIS — G4733 Obstructive sleep apnea (adult) (pediatric): Secondary | ICD-10-CM

## 2019-10-06 DIAGNOSIS — G478 Other sleep disorders: Secondary | ICD-10-CM

## 2019-10-06 DIAGNOSIS — R0683 Snoring: Secondary | ICD-10-CM

## 2019-10-06 DIAGNOSIS — R519 Headache, unspecified: Secondary | ICD-10-CM

## 2019-10-08 ENCOUNTER — Ambulatory Visit (INDEPENDENT_AMBULATORY_CARE_PROVIDER_SITE_OTHER): Payer: 59 | Admitting: Family Medicine

## 2019-10-08 ENCOUNTER — Ambulatory Visit: Payer: Self-pay

## 2019-10-08 ENCOUNTER — Encounter: Payer: Self-pay | Admitting: Family Medicine

## 2019-10-08 ENCOUNTER — Ambulatory Visit (INDEPENDENT_AMBULATORY_CARE_PROVIDER_SITE_OTHER): Payer: 59

## 2019-10-08 ENCOUNTER — Other Ambulatory Visit: Payer: Self-pay

## 2019-10-08 VITALS — BP 124/86 | HR 83 | Ht 66.0 in | Wt 278.0 lb

## 2019-10-08 DIAGNOSIS — M1712 Unilateral primary osteoarthritis, left knee: Secondary | ICD-10-CM | POA: Diagnosis not present

## 2019-10-08 DIAGNOSIS — M25562 Pain in left knee: Secondary | ICD-10-CM

## 2019-10-08 DIAGNOSIS — M2242 Chondromalacia patellae, left knee: Secondary | ICD-10-CM | POA: Diagnosis not present

## 2019-10-08 MED ORDER — VITAMIN D (ERGOCALCIFEROL) 1.25 MG (50000 UNIT) PO CAPS
50000.0000 [IU] | ORAL_CAPSULE | ORAL | 0 refills | Status: AC
Start: 2019-10-08 — End: ?

## 2019-10-08 MED ORDER — PREDNISONE 50 MG PO TABS: ORAL_TABLET | ORAL | 0 refills | Status: AC

## 2019-10-08 MED FILL — predniSONE 50 MG TABS: 50 | 5 days supply | Qty: 5 | Fill #0

## 2019-10-08 MED FILL — VIT D2 1.25 MG (50,000 UNIT: 1.25 MG | 84 days supply | Qty: 12 | Fill #0

## 2019-10-08 NOTE — Progress Notes (Signed)
[facility] [address] [place] [address] Phone: [phone] Subjective:   Misty Mathis, am serving as a scribe for Dr. Antoine Primas. This visit occurred during the SARS-CoV-2 public health emergency.  Safety protocols were in place, including screening questions prior to the visit, additional usage of staff PPE, and extensive cleaning of exam room while observing appropriate contact time as indicated for disinfecting solutions.   I'm seeing this patient by the request  of:  Misty Broker, MD  CC: Left knee pain  FUX:[id number]   07/16/2019 New problem.  Discussed vitamin D supplementation, icing regimen, home exercise, topical anti-inflammatories, encouraged good ergonomics.  Work with Event organiser.  Follow-up again in 4 to 6 weeks worsening pain will consider formal physical therapy and injections  Update 10/08/2019 Misty Mathis is a 34 y.o. female coming in with complaint of left knee pain. Patient states that she has had pain in left knee over anterior-medial aspect of knee. Is trying to be more active and is having pain after riding a bike. Knee pain increases when going up stairs. Does feel like knee is going to give out on her.      Past Medical History:  Diagnosis Date  . Abnormal Pap smear 07   colposcopy with biopsy  . Anemia   . Anxiety   . Back pain   . Edema, lower extremity   . Herpes simplex   . Hypertension   . Hypothyroid   . Joint pain   . Obesity   . Palpitation   . Prediabetes   . Shortness of breath   . Sleep apnea   . Vitamin D deficiency    Past Surgical History:  Procedure Laterality Date  . ANKLE SURGERY  02/2009  . BREAST CYST EXCISION  2016  . PILONIDAL CYST EXCISION    . TUBAL LIGATION  01/14/2011   Procedure: POST PARTUM TUBAL LIGATION;  Surgeon: Caprice Beaver;  Location: [facility];  Service: Gynecology;  Laterality: Bilateral;  . uterine ablation  2017   Social History    Socioeconomic History  . Marital status: Married    Spouse name: Not on file  . Number of children: 3  . Years of education: Not on file  . Highest education level: Not on file  Occupational History  . Occupation: Media planner  Tobacco Use  . Smoking status: Never Smoker  . Smokeless tobacco: Never Used  Substance and Sexual Activity  . Alcohol use: No  . Drug use: No  . Sexual activity: Yes    Partners: Male    Birth control/protection: None  Other Topics Concern  . Not on file  Social History Narrative  . Not on file   Social Determinants of Health   Financial Resource Strain:   . Difficulty of Paying Living Expenses:   Food Insecurity:   . Worried About Programme researcher, broadcasting/film/video in the Last Year:   . Barista in the Last Year:   Transportation Needs:   . Freight forwarder (Medical):   Marland Kitchen Lack of Transportation (Non-Medical):   Physical Activity:   . Days of Exercise per Week:   . Minutes of Exercise per Session:   Stress:   . Feeling of Stress :   Social Connections:   . Frequency of Communication with Friends and Family:   . Frequency of Social Gatherings with Friends and Family:   . Attends Religious Services:   . Active  Member of Clubs or Organizations:   . Attends Banker Meetings:   Marland Kitchen Marital Status:    Allergies  Allergen Reactions  . Peanut Oil Other (See Comments)    Had testing but never had a real reaction to peanuts.  . Phentermine Other (See Comments)    Tachycardia   Family History  Problem Relation Age of Onset  . Breast cancer Mother 49  . Hyperlipidemia Mother   . Colon cancer Maternal Aunt 34  . Lung cancer Maternal Grandfather   . Diabetes Maternal Grandmother   . Hypertension Brother   . Heart attack Neg Hx     Current Outpatient Medications (Endocrine & Metabolic):  .  predniSONE (DELTASONE) 50 MG tablet, Take one tablet daily for the next 5 days.  Current Outpatient Medications (Cardiovascular):  Marland Kitchen   EPINEPHrine 0.3 mg/0.3 mL IJ SOAJ injection, Inject 0.3 mLs (0.3 mg total) into the muscle as needed for anaphylaxis.  Current Outpatient Medications (Respiratory):  .  cetirizine (ZYRTEC) 10 MG tablet, Take 10 mg by mouth daily as needed.  Current Outpatient Medications (Analgesics):  Marland Kitchen  Butalbital-APAP-Caffeine 50-325-40 MG capsule, Take 1 capsule by mouth every 6 (six) hours as needed for pain.   Current Outpatient Medications (Other):  Marland Kitchen  Diclofenac Sodium (PENNSAID) 2 % SOLN, Pennsaid 20 mg/gram/actuation (2 %) topical soln in metered-dose pump  Apply 2 (two) pumps topically to affected area(s) twice a day .  Vitamin D, Ergocalciferol, (DRISDOL) 1.25 MG (50000 UNIT) CAPS capsule, Take 1 capsule (50,000 Units total) by mouth every 3 (three) days. .  Vitamin D, Ergocalciferol, (DRISDOL) 1.25 MG (50000 UNIT) CAPS capsule, Take 1 capsule (50,000 Units total) by mouth every 7 (seven) days.   Reviewed prior external information including notes and imaging from  primary care provider As well as notes that were available from care everywhere and other healthcare systems.  Past medical history, social, surgical and family history all reviewed in electronic medical record.  No pertanent information unless stated regarding to the chief complaint.   Review of Systems:  No headache, visual changes, nausea, vomiting, diarrhea, constipation, dizziness, abdominal pain, skin rash, fevers, chills, night sweats, weight loss, swollen lymph nodes, body aches, joint swelling, chest pain, shortness of breath, mood changes. POSITIVE muscle aches  Objective  Blood pressure 124/86, pulse 83, height 5\' 6"  (1.676 m), weight 278 lb (126.1 kg), last menstrual period 09/11/2019, SpO2 98 %.   General: No apparent distress alert and oriented x3 mood and affect normal, dressed appropriately.  HEENT: Pupils equal, extraocular movements intact  Respiratory: Patient's speak in full sentences and does not appear short  of breath  Cardiovascular: No lower extremity edema, non tender, no erythema  Neuro: Cranial nerves II through XII are intact, neurovascularly intact in all extremities with 2+ DTRs and 2+ pulses.  Gait normal with good balance and coordination.  MSK:  Left knee has some tenderness in the medial joint space and does have a little synovitis.  Movable.  Seems to be patient recently does have positive patellar grind noted on the left knee with patellar grind.  Musculoskeletal ultrasound was performed 11/11/2019  Limited ultrasound of patient's left knee shows the patient does have a calcific significant dysarthria shoulder arm pain medial aspect as needed.  Difficult to see secondary to the amount of calcium how large   Moderate patellofemoral arthritis    Impression and Recommendations:     The above documentation has been reviewed and is accurate and  complete Lyndal Pulley, DO       Note: This dictation was prepared with Dragon dictation along with smaller phrase technology. Any transcriptional errors that result from this process are unintentional.

## 2019-10-08 NOTE — Assessment & Plan Note (Signed)
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  On ultrasound today though appears to have a calcified soft tissue mass on the medial aspect of the knee.  Freely movable.  No abnormal blood flow.  Patient also has a synovitis of the knee and recent diagnosis of Hashimoto's disease.  Discussed topical anti-inflammatories, vitamin D, x-rays pending.  Follow-up again in 4 weeks.  Worsening symptoms will consider the possibility of injections of the calcified mass versus advanced imaging

## 2019-10-08 NOTE — Patient Instructions (Signed)
Prednisone for 5 days Vit D once weekly Warm compress and massage area Xray today Pennsaid See me in 3-4 weeks ok to double book if not better will consider injection or MRI

## 2019-10-09 ENCOUNTER — Other Ambulatory Visit: Payer: Self-pay

## 2019-10-09 MED ORDER — VITAMIN D (ERGOCALCIFEROL) 1.25 MG (50000 UNIT) PO CAPS
50000.0000 [IU] | ORAL_CAPSULE | ORAL | 0 refills | Status: AC
Start: 2019-10-09 — End: ?

## 2019-10-17 DIAGNOSIS — R0683 Snoring: Secondary | ICD-10-CM | POA: Insufficient documentation

## 2019-10-17 DIAGNOSIS — R519 Headache, unspecified: Secondary | ICD-10-CM | POA: Insufficient documentation

## 2019-10-17 DIAGNOSIS — G478 Other sleep disorders: Secondary | ICD-10-CM | POA: Insufficient documentation

## 2019-10-17 NOTE — Procedures (Signed)
PATIENT'S NAME:  Misty Mathis, Misty Mathis DOB:      [birth year]      MR#:    [id number]     DATE OF RECORDING: 10/06/2019 REFERRING M.D.:  Helane Rima, DO Study Performed:   Baseline Polysomnogram HISTORY:  This 34 year -old African American female and [employer] employee was seen upon referral on 09/23/2019 from Dr Earlene Plater. Chief concern according to patient: " I had an ENT doctor send me a HST sleep study but no data resulted"  I have the pleasure of seeing Estle Sabella today, a right -handed African American female with a possible sleep disorder.  She   has a past medical history of Abnormal Pap smear (07), Anemia, Anxiety, Back pain, Edema, lower extremity, Hypertension, Hypothyroid, Joint pain, morbid Obesity, Palpitation, Prediabetes, Shortness of breath and Vitamin D deficiency.   Sleep habits are as follows: The patient's dinner time is between 6-7 PM. The patient goes to bed at 10.30 PM and struggles 2-3 times a week with difficulties to fall asleep. She continues to sleep for 3-4 hours, wakes from external stimuli.    6.15 AM is the usual rise time.  She reports not feeling refreshed or restored in AM, with symptoms such as dry mouth, morning headaches, and residual fatigue. Naps are taken infrequently, lasting from 1-2 hours -less refreshing than nocturnal sleep.  The patient endorsed the Epworth Sleepiness Scale at 7 points.   The patient's weight 269 pounds with a height of 66 (inches), resulting in a BMI of 43.2 kg/m2. The patient's neck circumference measured 16 inches.  CURRENT MEDICATIONS: Norvasc, Zyrtec, Pensaid, Epinephrine   PROCEDURE:  This is a multichannel digital polysomnogram utilizing the Somnostar 11.2 system.  Electrodes and sensors were applied and monitored per AASM Specifications.   EEG, EOG, Chin and Limb EMG, were sampled at 200 Hz.  ECG, Snore and Nasal Pressure, Thermal Airflow, Respiratory Effort, CPAP Flow and Pressure, Oximetry was sampled at 50 Hz.  Digital video and audio were recorded.      BASELINE STUDY: Lights Out was at 22:25 and Lights On at 05:05.  Total recording time (TRT) was 401 minutes, with a total sleep time (TST) of 330 minutes.   The patient's sleep latency was 33 minutes.  REM latency was 98 minutes.  The sleep efficiency was 82.3 %.     SLEEP ARCHITECTURE: WASO (Wake after sleep onset) was 42.5 minutes.  There were 21 minutes in Stage N1, 163.5 minutes Stage N2, 92 minutes Stage N3 and 53.5 minutes in Stage REM.  The percentage of Stage N1 was 6.4%, Stage N2 was 49.5%, Stage N3 was 27.9% and Stage R (REM sleep) was 16.2%.  RESPIRATORY ANALYSIS:  There were a total of 10 respiratory events:  0 obstructive apneas, 0 central apneas and 0 mixed apneas with a total of 0 apneas and an apnea index (AI) of 0 /hour. There were 10 hypopneas with a hypopnea index of 1.8 /hour. The patient also had 0 respiratory event related arousals (RERAs).      The total APNEA/HYPOPNEA INDEX (AHI) was 1.8/hour.  10 events occurred in REM sleep and 0 events in NREM. The REM AHI was 11.2 /hour, versus a non-REM AHI of 0. The patient spent 0 minutes of total sleep time in the supine position and 330 minutes in non-supine.. The supine AHI was 0.0 versus a non-supine AHI of 1.8.  OXYGEN SATURATION & C02:  The Wake baseline 02 saturation was 96%, with the lowest being 90%.  Time spent below 89% saturation equaled 0 minutes.  The arousals were noted as: 47 were spontaneous, 0 were associated with PLMs, 1 were associated with respiratory events. The patient had a total of 0 Periodic Limb Movements.   Audio and video analysis did not show any abnormal or unusual movements, behaviors, phonations or vocalizations.   Loud Snoring was noted. EKG was in keeping with normal sinus rhythm (NSR).  IMPRESSION: 1. This study documented loud snoring associated with UARS, rather than Obstructive Sleep Apnea (OSA). 2. Only in REM sleep was there a noteworthy AHI of  11.2/h  RECOMMENDATIONS: This patient has snoring related arousals and REM sleep dependent sleep apnea . The documented Apnea is too mild to qualify for CPAP therapy or dental device therapy.   Goal will be weight loss and dental treatment of snoring, but I cannot use the diagnosis of apnea as a qualifier.     I certify that I have reviewed the entire raw data recording prior to the issuance of this report in accordance with the Standards of Accreditation of the American Academy of Sleep Medicine (AASM)    Melvyn Novas, MD Diplomat, American Board of Psychiatry and Neurology  Diplomat, American Board of Sleep Medicine Wellsite geologist, [place] [facility] [employer]

## 2019-10-17 NOTE — Progress Notes (Signed)
The arousals were noted as: 47 were spontaneous, 0 were  associated with PLMs, 1 was associated with respiratory events. IMPRESSION:  1. This study documented loud snoring associated with UARS,  rather than Obstructive Sleep Apnea (OSA).  2. Only in REM sleep was there a noteworthy AHI of 11.2/h   RECOMMENDATIONS: This patient has snoring related arousals and  REM sleep dependent sleep apnea . The documented Apnea is too  mild to qualify for CPAP therapy or dental device therapy.   Goal will be weight loss and dental treatment of snoring, but I  cannot use the diagnosis of apnea as a qualifier.  Spontaneous arousals are often related to pain.

## 2019-10-18 ENCOUNTER — Telehealth: Payer: Self-pay | Admitting: Neurology

## 2019-10-18 NOTE — Telephone Encounter (Signed)
-----   Message from Melvyn Novas, MD sent at 10/17/2019  6:57 PM EDT ----- The arousals were noted as: 47 were spontaneous, 0 were  associated with PLMs, 1 was associated with respiratory events. IMPRESSION:  1. This study documented loud snoring associated with UARS,  rather than Obstructive Sleep Apnea (OSA).  2. Only in REM sleep was there a noteworthy AHI of 11.2/h   RECOMMENDATIONS: This patient has snoring related arousals and  REM sleep dependent sleep apnea . The documented Apnea is too  mild to qualify for CPAP therapy or dental device therapy.   Goal will be weight loss and dental treatment of snoring, but I  cannot use the diagnosis of apnea as a qualifier.  Spontaneous arousals are often related to pain.

## 2019-10-18 NOTE — Telephone Encounter (Signed)
Called and reviewed the sleep study with the patient. Advised that the study indicated loud snoring to be present but there was no concern of apnea, low oxygen or heart rate irregularities. Advised the patient that Dr Vickey Huger would recommend weight loss or dental device to help with snoring. Pt verbalized understanding. Pt had no questions at this time but was encouraged to call back if questions arise.

## 2019-11-01 ENCOUNTER — Ambulatory Visit: Payer: 59 | Admitting: Internal Medicine

## 2019-11-05 ENCOUNTER — Encounter: Payer: Self-pay | Admitting: Family Medicine

## 2019-11-05 ENCOUNTER — Other Ambulatory Visit: Payer: Self-pay

## 2019-11-05 ENCOUNTER — Ambulatory Visit (INDEPENDENT_AMBULATORY_CARE_PROVIDER_SITE_OTHER): Payer: 59 | Admitting: Family Medicine

## 2019-11-05 ENCOUNTER — Ambulatory Visit: Payer: 59 | Admitting: Family Medicine

## 2019-11-05 ENCOUNTER — Ambulatory Visit: Payer: Self-pay

## 2019-11-05 VITALS — BP 142/90 | HR 89 | Ht 66.0 in | Wt 278.0 lb

## 2019-11-05 DIAGNOSIS — M25561 Pain in right knee: Secondary | ICD-10-CM

## 2019-11-05 DIAGNOSIS — R2231 Localized swelling, mass and lump, right upper limb: Secondary | ICD-10-CM

## 2019-11-05 DIAGNOSIS — M2392 Unspecified internal derangement of left knee: Secondary | ICD-10-CM | POA: Diagnosis not present

## 2019-11-05 DIAGNOSIS — M25562 Pain in left knee: Secondary | ICD-10-CM

## 2019-11-05 DIAGNOSIS — G8929 Other chronic pain: Secondary | ICD-10-CM | POA: Diagnosis not present

## 2019-11-05 DIAGNOSIS — M79621 Pain in right upper arm: Secondary | ICD-10-CM | POA: Diagnosis not present

## 2019-11-05 NOTE — Assessment & Plan Note (Signed)
Patient did have very mild abnormal mammogram done when she was 32 and then had the abnormal lymphadenopathy in this area the patient feels like has enlarged over the course of the last year and a half.  We will get another ultrasound at this point to further evaluate first.  May need the possibility of an MRI.  Patient is scheduled likely for another mammogram at the end of this year in December.  Family history with mother having breast cancer in her 45s.

## 2019-11-05 NOTE — Assessment & Plan Note (Signed)
No significant improvement.  Patient does have some mild underlying arthritic changes.  I am concerned that the patient does have this cyst formation noted on ultrasound that is highly calcified.  Difficult to assess completely and I do feel that a MRI is necessary.  This will also show if this is a parameniscal cyst or has any type of communication intra-articular.  Patient does have some mild locking from time to time.  I like to do this before we consider injections.  Follow-up again after

## 2019-11-05 NOTE — Patient Instructions (Addendum)
Good to see you MRI knee with contrast  We will talk after MRI

## 2019-11-05 NOTE — Progress Notes (Signed)
[name] [address] [place] [address] Phone: [phone] Subjective:   I Misty Mathis am serving as a Neurosurgeon for Dr. Antoine Primas.  This visit occurred during the SARS-CoV-2 public health emergency.  Safety protocols were in place, including screening questions prior to the visit, additional usage of staff PPE, and extensive cleaning of exam room while observing appropriate contact time as indicated for disinfecting solutions.   I'm seeing this patient by the request  of:  Myrlene Broker, MD  CC: Knee pain follow-up, and  axillary mass  QMG:[id number]   10/04/2019 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  On ultrasound today though appears to have a calcified soft tissue mass on the medial aspect of the knee.  Freely movable.  No abnormal blood flow.  Patient also has a synovitis of the knee and recent diagnosis of Hashimoto's disease.  Discussed topical anti-inflammatories, vitamin D, x-rays pending.  Follow-up again in 4 weeks.  Worsening symptoms will consider the possibility of injections of the calcified mass versus advanced imaging  Update 11/05/2019 Misty Mathis is a 34 y.o. female coming in with complaint of left knee pain. Patient states it feels about the same. Medication helped a little. Patient states she has a knot under her right armpit. Would like an ultrasound. The knot is large and she states it is not painful.  Patient states that she has had a mammogram done previously and was told questionable lymph node that would decrease in size family history significant for mother having breast cancer  Reviewed patient's chart and patient did have an ultrasound of the axilla on the right side.    Past Medical History:  Diagnosis Date  . Abnormal Pap smear 07   colposcopy with biopsy  . Anemia   . Anxiety   . Back pain   . Edema, lower extremity   . Herpes simplex   . Hypertension   . Hypothyroid    . Joint pain   . Obesity   . Palpitation   . Prediabetes   . Shortness of breath   . Sleep apnea   . Vitamin D deficiency    Past Surgical History:  Procedure Laterality Date  . ANKLE SURGERY  02/2009  . BREAST CYST EXCISION  2016  . PILONIDAL CYST EXCISION    . TUBAL LIGATION  01/14/2011   Procedure: POST PARTUM TUBAL LIGATION;  Surgeon: Caprice Beaver;  Location: [facility];  Service: Gynecology;  Laterality: Bilateral;  . uterine ablation  2017   Social History   Socioeconomic History  . Marital status: Married    Spouse name: Not on file  . Number of children: 3  . Years of education: Not on file  . Highest education level: Not on file  Occupational History  . Occupation: Media planner  Tobacco Use  . Smoking status: Never Smoker  . Smokeless tobacco: Never Used  Substance and Sexual Activity  . Alcohol use: No  . Drug use: No  . Sexual activity: Yes    Partners: Male    Birth control/protection: None  Other Topics Concern  . Not on file  Social History Narrative  . Not on file   Social Determinants of Health   Financial Resource Strain:   . Difficulty of Paying Living Expenses:   Food Insecurity:   . Worried About Programme researcher, broadcasting/film/video in the Last Year:   . [employer] of Food in the Last Year:  Transportation Needs:   . Freight forwarder (Medical):   Marland Kitchen Lack of Transportation (Non-Medical):   Physical Activity:   . Days of Exercise per Week:   . Minutes of Exercise per Session:   Stress:   . Feeling of Stress :   Social Connections:   . Frequency of Communication with Friends and Family:   . Frequency of Social Gatherings with Friends and Family:   . Attends Religious Services:   . Active Member of Clubs or Organizations:   . Attends Banker Meetings:   Marland Kitchen Marital Status:    Allergies  Allergen Reactions  . Peanut Oil Other (See Comments)    Had testing but never had a real reaction to peanuts.  . Phentermine Other (See Comments)     Tachycardia   Family History  Problem Relation Age of Onset  . Breast cancer Mother 42  . Hyperlipidemia Mother   . Colon cancer Maternal Aunt 34  . Lung cancer Maternal Grandfather   . Diabetes Maternal Grandmother   . Hypertension Brother   . Heart attack Neg Hx     Current Outpatient Medications (Endocrine & Metabolic):  .  predniSONE (DELTASONE) 50 MG tablet, Take one tablet daily for the next 5 days.  Current Outpatient Medications (Cardiovascular):  Marland Kitchen  EPINEPHrine 0.3 mg/0.3 mL IJ SOAJ injection, Inject 0.3 mLs (0.3 mg total) into the muscle as needed for anaphylaxis.  Current Outpatient Medications (Respiratory):  .  cetirizine (ZYRTEC) 10 MG tablet, Take 10 mg by mouth daily as needed.  Current Outpatient Medications (Analgesics):  Marland Kitchen  Butalbital-APAP-Caffeine 50-325-40 MG capsule, Take 1 capsule by mouth every 6 (six) hours as needed for pain.   Current Outpatient Medications (Other):  Marland Kitchen  Diclofenac Sodium (PENNSAID) 2 % SOLN, Pennsaid 20 mg/gram/actuation (2 %) topical soln in metered-dose pump  Apply 2 (two) pumps topically to affected area(s) twice a day .  Vitamin D, Ergocalciferol, (DRISDOL) 1.25 MG (50000 UNIT) CAPS capsule, Take 1 capsule (50,000 Units total) by mouth every 3 (three) days. .  Vitamin D, Ergocalciferol, (DRISDOL) 1.25 MG (50000 UNIT) CAPS capsule, Take 1 capsule (50,000 Units total) by mouth every 7 (seven) days. .  Vitamin D, Ergocalciferol, (DRISDOL) 1.25 MG (50000 UNIT) CAPS capsule, Take 1 capsule (50,000 Units total) by mouth every 7 (seven) days.   Reviewed prior external information including notes and imaging from  primary care provider As well as notes that were available from care everywhere and other healthcare systems.  Past medical history, social, surgical and family history all reviewed in electronic medical record.  No pertanent information unless stated regarding to the chief complaint.   Review of Systems:  No headache, visual  changes, nausea, vomiting, diarrhea, constipation, dizziness, abdominal pain, skin rash, fevers, chills, night sweats, weight loss, swollen lymph nodes, body aches, joint swelling, chest pain, shortness of breath, mood changes. POSITIVE muscle aches  Objective  Blood pressure (!) 142/90, pulse 89, height 5\' 6"  (1.676 m), weight (!) 278 lb (126.1 kg), SpO2 98 %.   General: No apparent distress alert and oriented x3 mood and affect normal, dressed appropriately.  HEENT: Pupils equal, extraocular movements intact  Respiratory: Patient's speak in full sentences and does not appear short of breath  Cardiovascular: No lower extremity edema, non tender, no erythema  Neuro: Cranial nerves II through XII are intact, neurovascularly intact in all extremities with 2+ DTRs and 2+ pulses.  Gait normal with good balance and coordination.  MSK:  Patient's axillary  area and does have what appears to be fairly large possible scar tissue formation.  There are some mild shotty lymph nodes noted in the area.  Difficult to assess secondary to patient's body habitus  Left knee exam shows the patient does have tenderness to palpation over the medial joint space.  Mild lateral tracking of the patella noted.  Mild positive McMurray still noted.  Cystic mass noted on the medial aspect of the knee.  Tender and seems to be freely movable.   Limited musculoskeletal ultrasound was performed and interpreted by Judi Saa  Limited ultrasound of the knee shows the patient with medial cyst seems to be larger.  More calcific changes were makes it difficult to actually appreciate a large occlusive systems.  Does seem to potentially communicate intra-articular.   Impression and Recommendations:     The above documentation has been reviewed and is accurate and complete Judi Saa, DO       Note: This dictation was prepared with Dragon dictation along with smaller phrase technology. Any transcriptional errors that  result from this process are unintentional.

## 2019-11-06 ENCOUNTER — Other Ambulatory Visit: Payer: Self-pay | Admitting: Family Medicine

## 2019-11-06 DIAGNOSIS — M79621 Pain in right upper arm: Secondary | ICD-10-CM

## 2019-11-08 ENCOUNTER — Ambulatory Visit: Payer: 59 | Admitting: Internal Medicine

## 2019-12-01 ENCOUNTER — Ambulatory Visit
Admission: RE | Admit: 2019-12-01 | Discharge: 2019-12-01 | Disposition: A | Discharge status: Home / Self Care | Payer: 59 | Source: Ambulatory Visit | Attending: Family Medicine | Admitting: Family Medicine

## 2019-12-01 DIAGNOSIS — R2242 Localized swelling, mass and lump, left lower limb: Secondary | ICD-10-CM | POA: Diagnosis not present

## 2019-12-01 DIAGNOSIS — M25562 Pain in left knee: Secondary | ICD-10-CM | POA: Diagnosis not present

## 2019-12-01 DIAGNOSIS — G8929 Other chronic pain: Secondary | ICD-10-CM

## 2019-12-01 MED ORDER — GADOBENATE DIMEGLUMINE 529 MG/ML IV SOLN
20.0000 mL | Freq: Once | INTRAVENOUS | Status: AC | PRN
  Administered 2019-12-01: 20 mL via INTRAVENOUS

## 2019-12-02 ENCOUNTER — Encounter: Payer: Self-pay | Admitting: Family Medicine

## 2019-12-02 ENCOUNTER — Other Ambulatory Visit: Payer: 59

## 2019-12-03 ENCOUNTER — Other Ambulatory Visit: Payer: Self-pay

## 2019-12-03 DIAGNOSIS — M2392 Unspecified internal derangement of left knee: Secondary | ICD-10-CM

## 2019-12-06 ENCOUNTER — Other Ambulatory Visit: Payer: 59

## 2019-12-06 DIAGNOSIS — M1712 Unilateral primary osteoarthritis, left knee: Secondary | ICD-10-CM | POA: Diagnosis not present

## 2019-12-06 DIAGNOSIS — Z6841 Body Mass Index (BMI) 40.0 and over, adult: Secondary | ICD-10-CM | POA: Diagnosis not present

## 2019-12-17 ENCOUNTER — Other Ambulatory Visit: Payer: 59

## 2020-01-10 ENCOUNTER — Other Ambulatory Visit: Payer: 59

## 2020-03-26 ENCOUNTER — Telehealth: Payer: Self-pay | Admitting: Family Medicine

## 2020-03-26 DIAGNOSIS — M79621 Pain in right upper arm: Secondary | ICD-10-CM

## 2020-03-26 NOTE — Telephone Encounter (Signed)
Pt has switched insurance and needs her mammogram order changed to [facility] on [address] in [place]. Phone [phone]. No rush.

## 2021-03-01 IMAGING — US US THYROID
1 series · 14 of 25 positions shown · non-contrast
Comparison: 10/27/2008 by report only

CLINICAL DATA: [place].  Asymmetry on physical examination.

EXAM:
THYROID ULTRASOUND
TECHNIQUE: Ultrasound examination of the thyroid gland and adjacent soft
tissues was performed.

[Series 1: us thyroid · 14 of 67 slices shown]
[im 1/67]
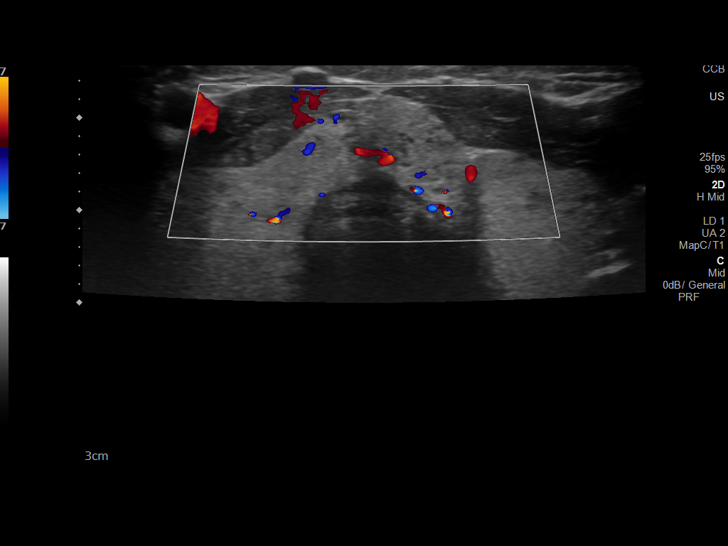
[im 6/67]
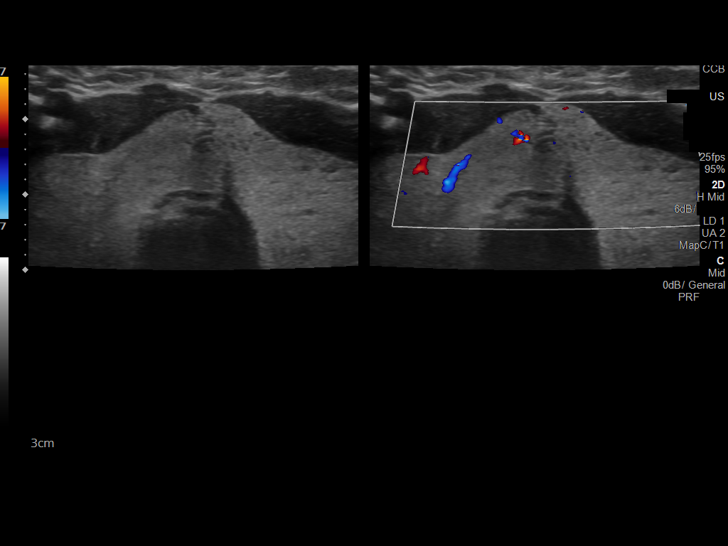
[im 12/67]
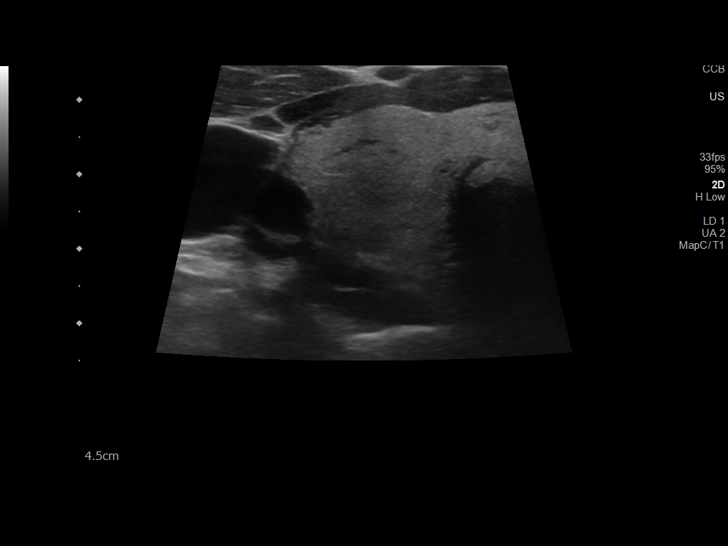
[im 17/67]
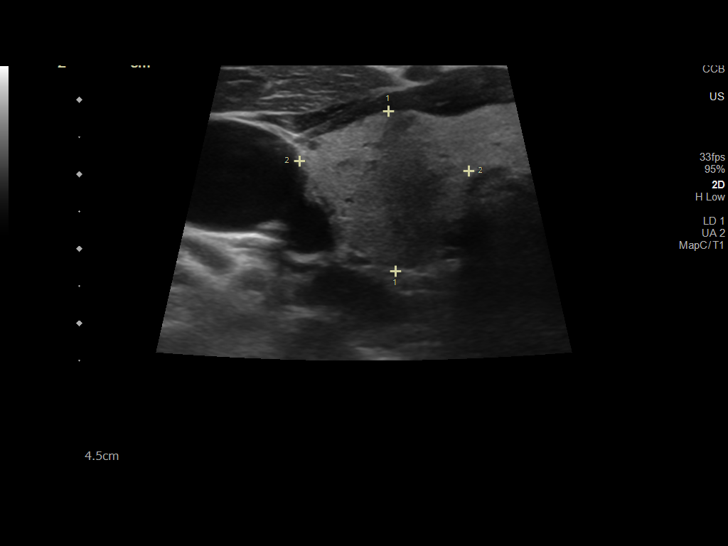
[im 23/67]
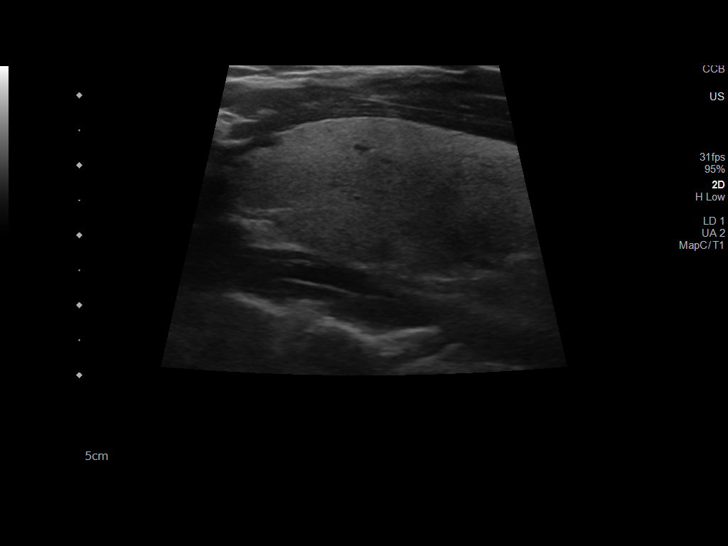
[im 25/67]
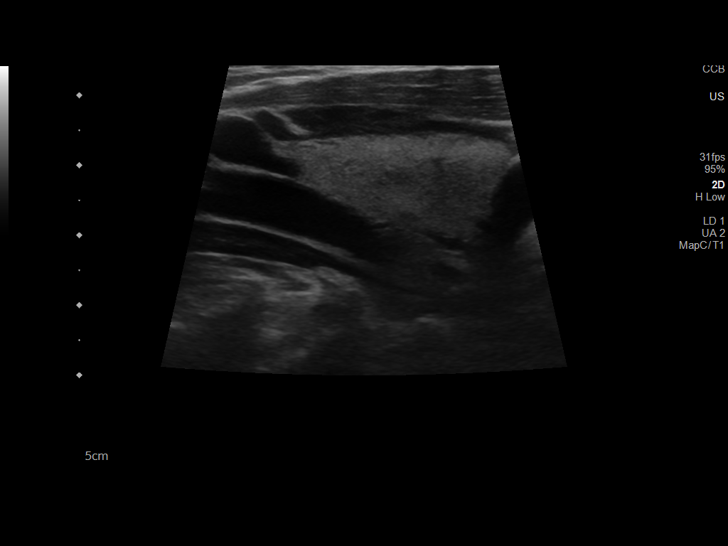
[im 31/67]
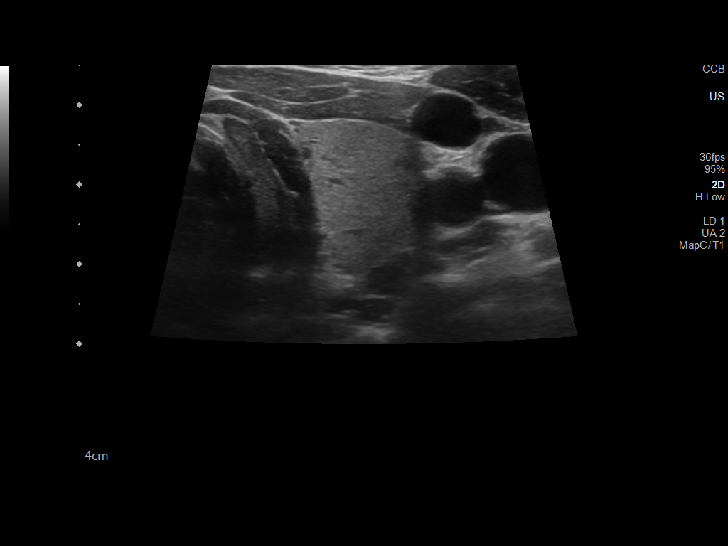
[im 36/67]
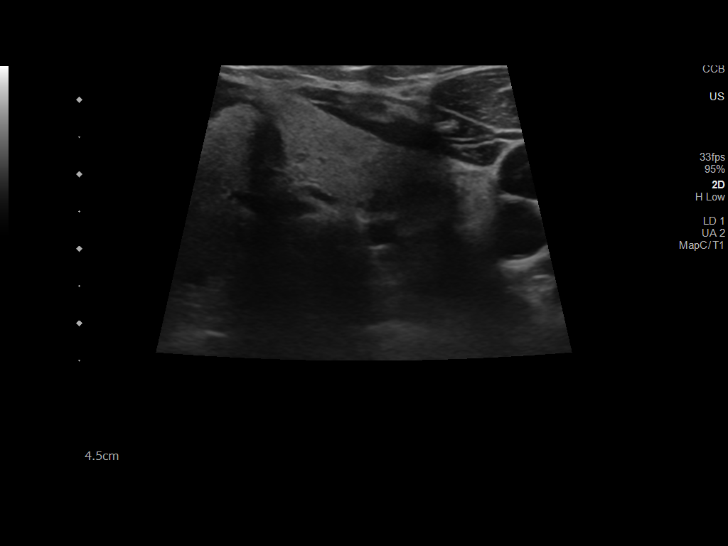
[im 42/67]
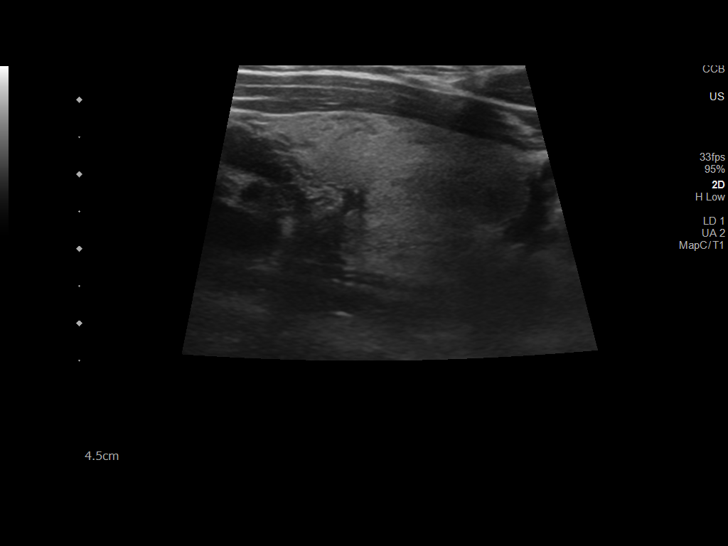
[im 45/67]
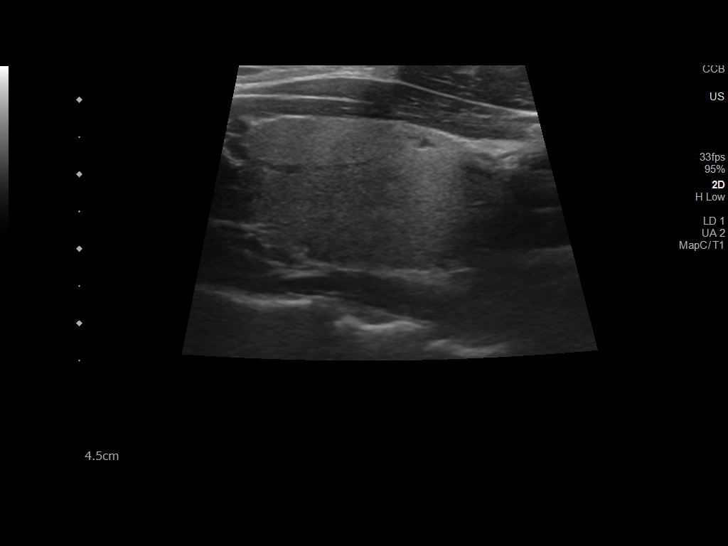
[im 50/67]
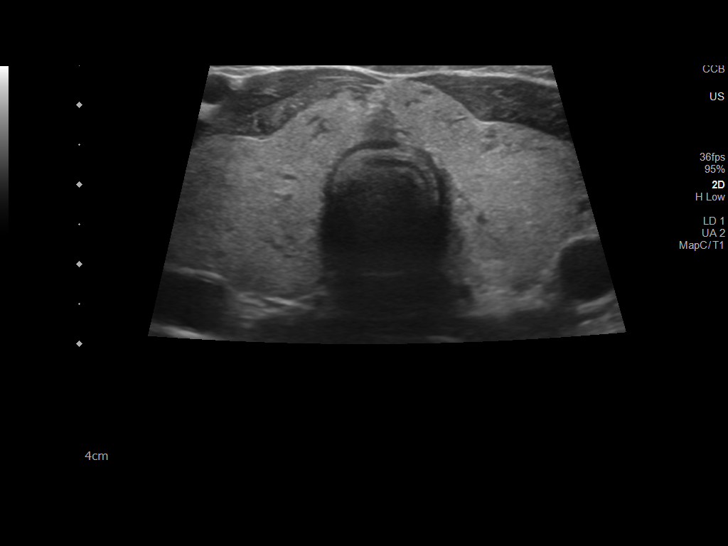
[im 56/67]
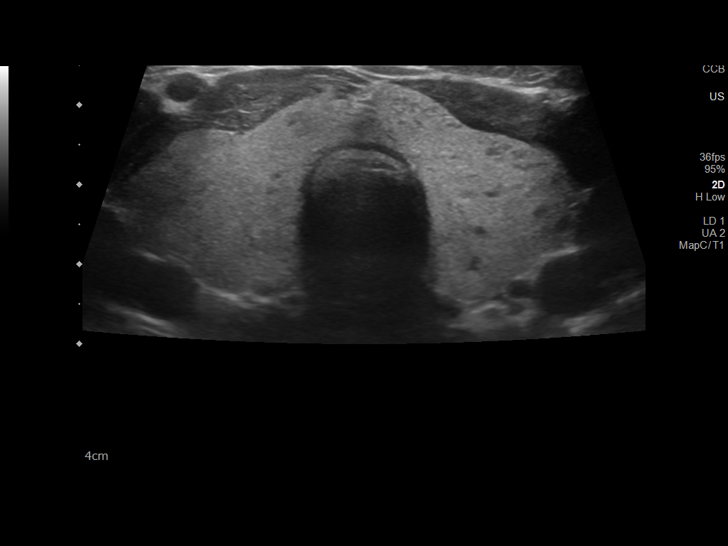
[im 61/67]
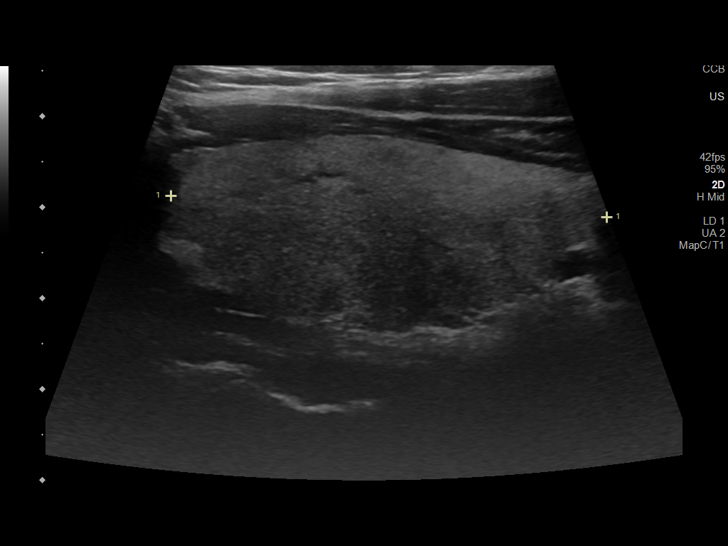
[im 67/67]
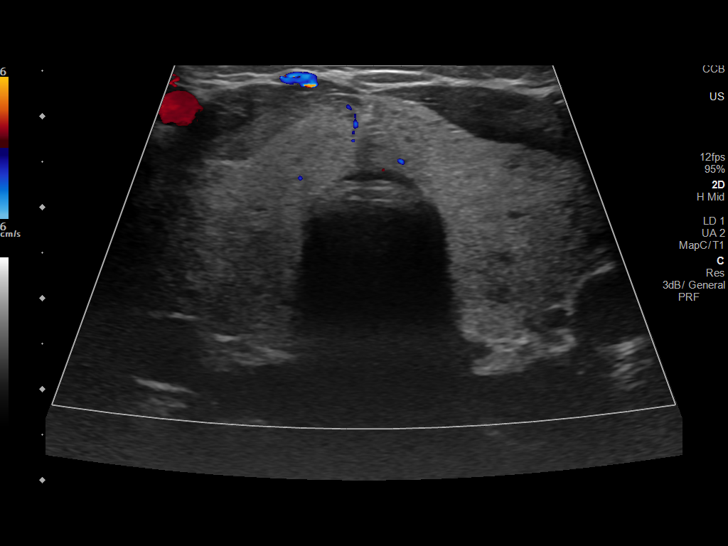

[14 of 25 positions shown; findings below may reference images not displayed]

FINDINGS: Parenchymal Echotexture: Moderately heterogenous

Isthmus: 1.1 cm thickness, previously

Right lobe: 4.8 x 2.2 x 2.3 cm, previously 5.6 x 2.3 x

Left lobe: 4.7 x 2.1 x 2.2 cm, previously 5.4 x 1.8 x

_________________________________________________________

Estimated total number of nodules >/= 1 cm: 0

Number of spongiform nodules >/=  2 cm not described below (TR1): 0

Number of mixed cystic and solid nodules >/= 1.5 cm not described
below (TR2): 0

_________________________________________________________

No discrete nodules are seen within the thyroid gland. No regional
adenopathy localized.
IMPRESSION: 1. Mild thyromegaly. Heterogenous parenchyma without discrete
nodule.

The above is in keeping with the ACR TI-RADS recommendations - [HOSPITAL] 3822;[DATE].

## 2021-03-12 IMAGING — DX DG KNEE 3 VIEWS*L*
3 series · 3 of 3 positions shown · non-contrast
Comparison: None.

CLINICAL DATA: Knee pain

EXAM:
LEFT KNEE - 3 VIEW

[knee ap]
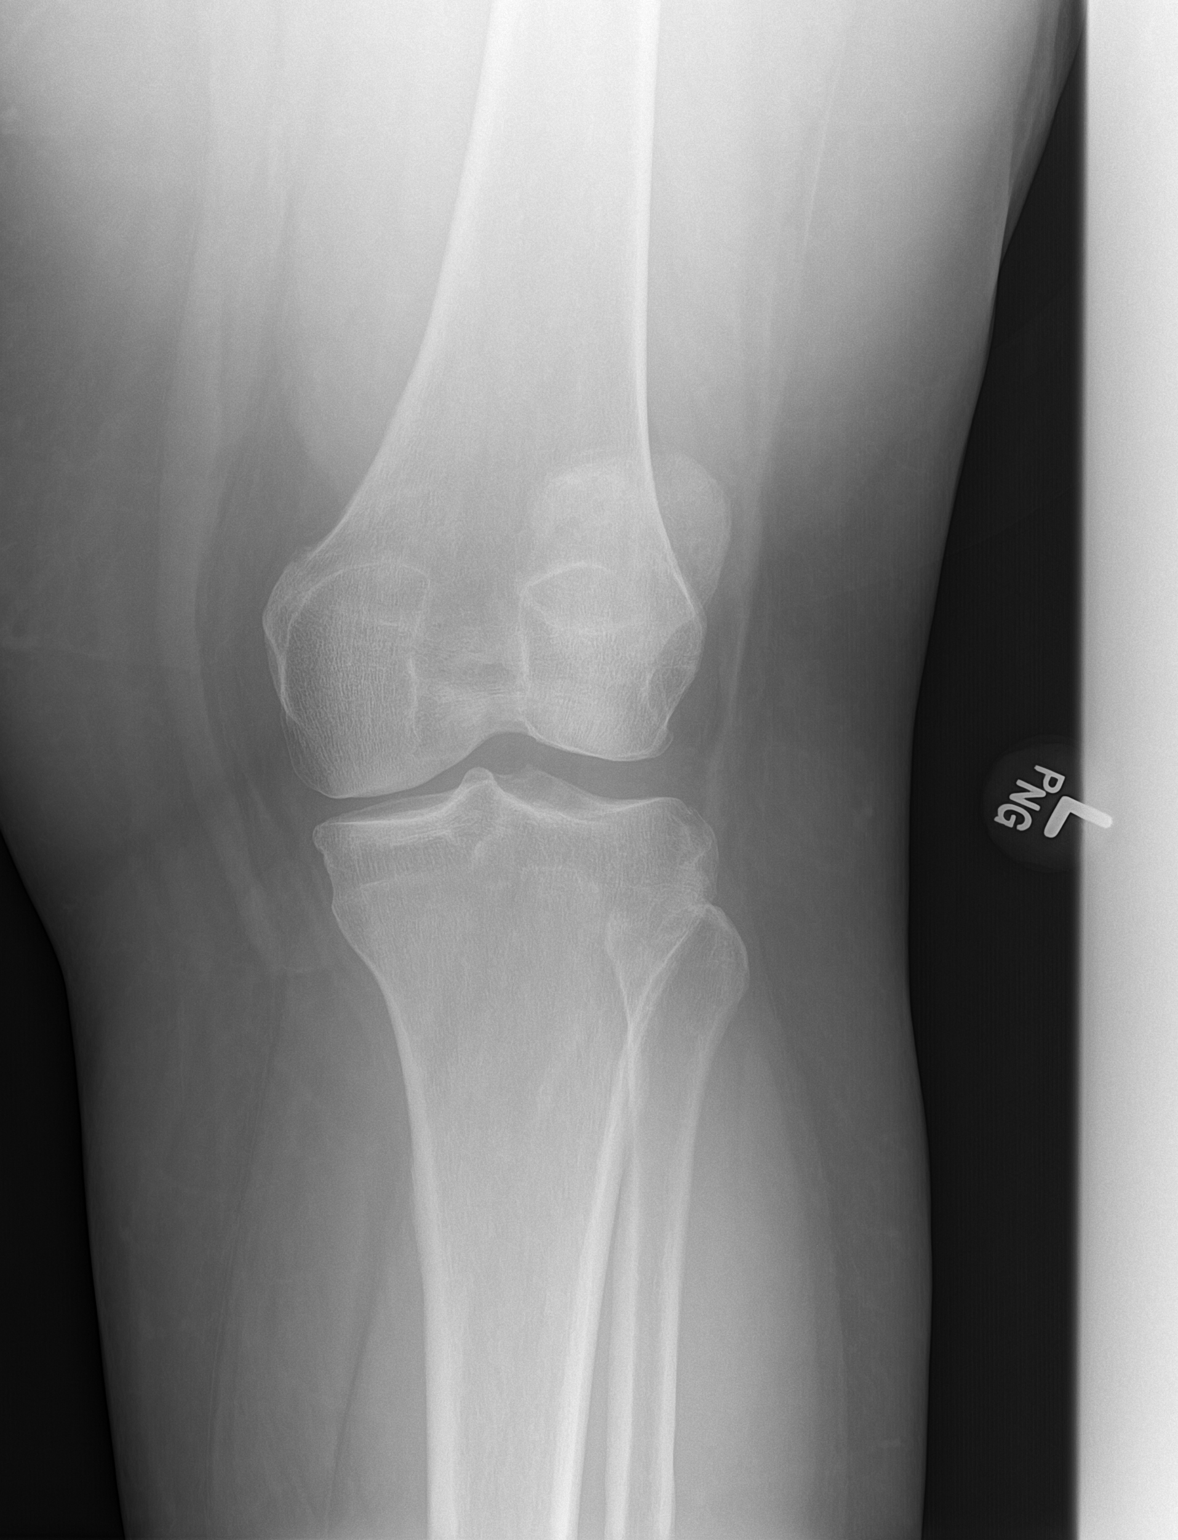

[knee lat]
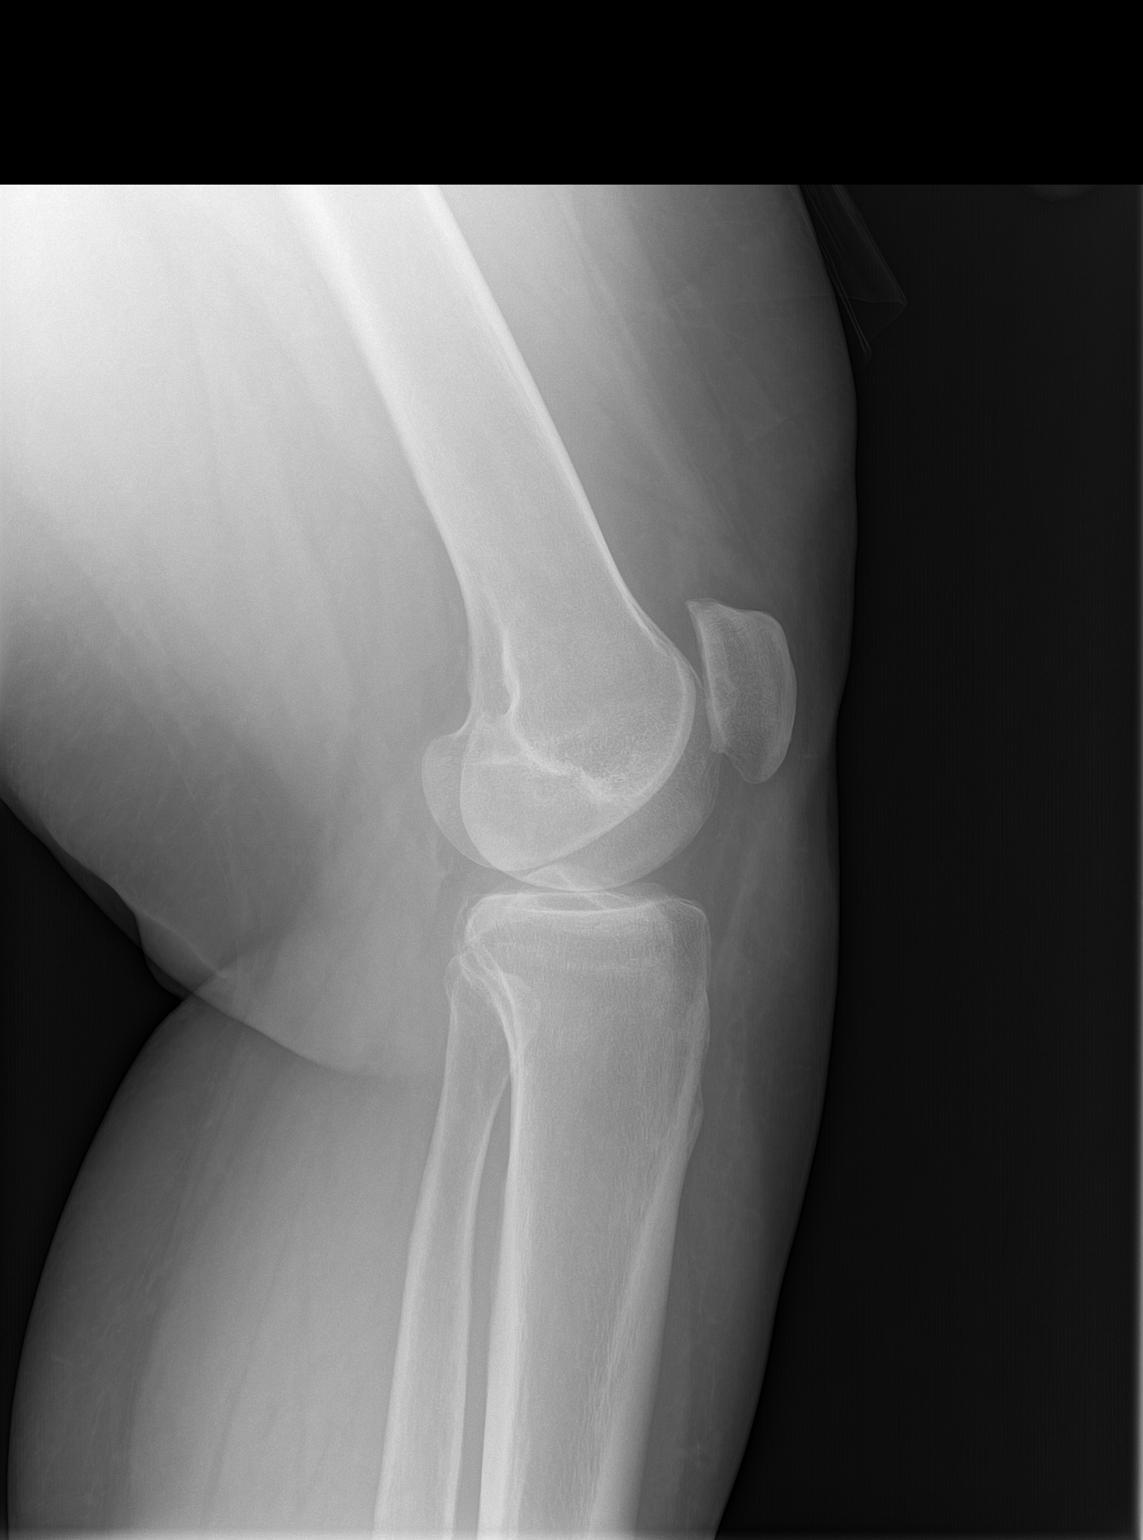

[patella]
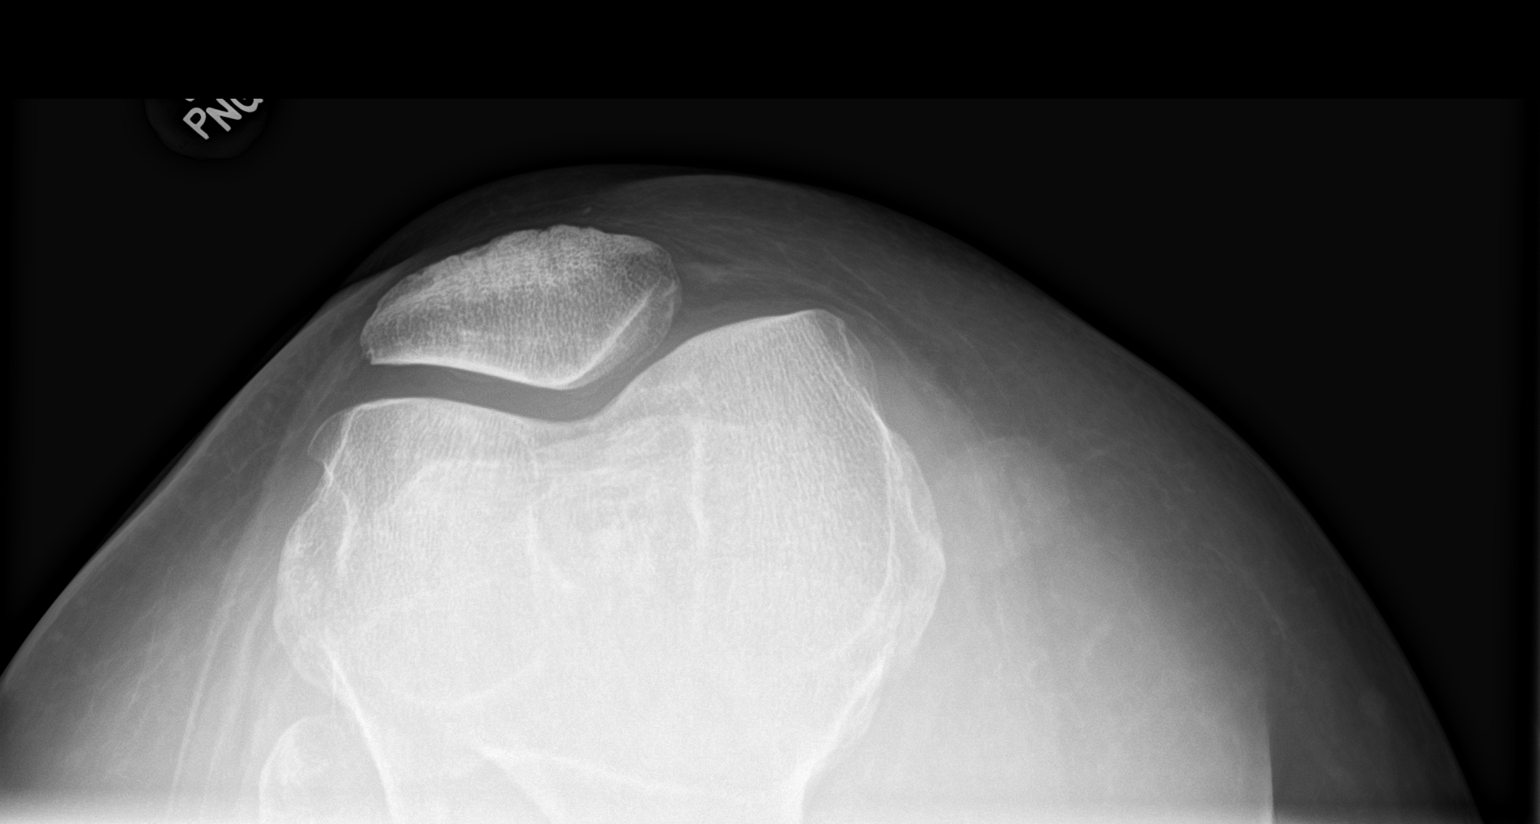

[3 of 3 positions shown; findings below may reference images not displayed]

FINDINGS: Alignment is anatomic. There is no acute fracture. No joint
effusion. Mild medial compartment joint space narrowing.
IMPRESSION: Mild medial compartment osteoarthritis.

## 2021-11-17 ENCOUNTER — Encounter (INDEPENDENT_AMBULATORY_CARE_PROVIDER_SITE_OTHER): Payer: Self-pay
